# Patient Record
Sex: Male | Born: 1975
Health system: Southern US, Community
[De-identification: ages and names within clinical notes are randomized; demographics above are authoritative.]

## PROBLEM LIST (undated history)

## (undated) ENCOUNTER — Emergency Department: Discharge status: Absent without leave | Payer: BC Managed Care – PPO

## (undated) DIAGNOSIS — E559 Vitamin D deficiency, unspecified: Secondary | ICD-10-CM

## (undated) DIAGNOSIS — E785 Hyperlipidemia, unspecified: Secondary | ICD-10-CM

## (undated) DIAGNOSIS — R6882 Decreased libido: Secondary | ICD-10-CM

## (undated) HISTORY — PX: HAND SURGERY: SHX662

## (undated) HISTORY — PX: BACK SURGERY: SHX140

---
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
## 2001-03-28 ENCOUNTER — Ambulatory Visit (HOSPITAL_COMMUNITY): Admission: RE | Admit: 2001-03-28 | Discharge: 2001-03-28 | Payer: Self-pay | Admitting: Neurological Surgery

## 2001-03-28 ENCOUNTER — Encounter: Payer: Self-pay | Admitting: Neurological Surgery

## 2001-07-30 ENCOUNTER — Emergency Department (HOSPITAL_COMMUNITY): Admission: EM | Admit: 2001-07-30 | Discharge: 2001-07-31 | Payer: Self-pay | Admitting: Internal Medicine

## 2003-04-12 ENCOUNTER — Emergency Department (HOSPITAL_COMMUNITY): Admission: EM | Admit: 2003-04-12 | Discharge: 2003-04-12 | Payer: Self-pay | Admitting: Emergency Medicine

## 2009-04-30 ENCOUNTER — Ambulatory Visit: Payer: Self-pay | Admitting: Orthopedic Surgery

## 2009-04-30 ENCOUNTER — Encounter (INDEPENDENT_AMBULATORY_CARE_PROVIDER_SITE_OTHER): Payer: Self-pay | Admitting: *Deleted

## 2009-04-30 DIAGNOSIS — M25519 Pain in unspecified shoulder: Secondary | ICD-10-CM

## 2009-04-30 DIAGNOSIS — M7710 Lateral epicondylitis, unspecified elbow: Secondary | ICD-10-CM | POA: Insufficient documentation

## 2009-05-03 ENCOUNTER — Encounter: Payer: Self-pay | Admitting: Orthopedic Surgery

## 2009-10-14 ENCOUNTER — Ambulatory Visit: Payer: Self-pay | Admitting: Orthopedic Surgery

## 2009-10-14 DIAGNOSIS — M659 Unspecified synovitis and tenosynovitis, unspecified site: Secondary | ICD-10-CM | POA: Insufficient documentation

## 2009-10-29 ENCOUNTER — Ambulatory Visit: Payer: Self-pay | Admitting: Orthopedic Surgery

## 2011-05-08 NOTE — Op Note (Signed)
[facility]  Patient:    Kyle Atkins, Kyle Atkins                        MRN: [id number] Proc. Date: 03/28/01 Adm. Date:  [id number] Disc. Date: [id number] Attending:  Jonne Ply                           Operative Report  PREOPERATIVE DIAGNOSIS:  L4-5 herniated nucleus pulposus on the right.  POSTOPERATIVE DIAGNOSIS:  L4-5 herniated nucleus pulposus on the right.  PROCEDURE:  Lumbar microendoscopic diskectomy, L4-5 right, with microdissection technique and operating microscope.  SURGEON:  Stefani Dama, M.D.  FIRST ASSISTANT:  Tanya Nones. Jeral Fruit, M.D.  ANESTHESIA:  General endotracheal.  INDICATIONS:  The patient is a 35 year old right-handed individual who has developed significant, quite severe back and right lower extremity pain with some weakness in the tibialis anterior group.  He was found to have a large herniated nucleus pulposus at the L4-5 level, eccentric to the right side.  He has failed efforts at conservative management over the past number of weeks, and he has continued and worsening pain and was advised regarding a microendoscopic diskectomy.  He was taken to the operating room.  PROCEDURE:  The patient was brought to operating room supine on the stretcher. After smooth induction of general endotracheal anesthesia, he was turned prone, and the back was shaved, prepped with Duraprep, and draped in a sterile fashion.  Fluoroscopy was used to localize the L4-5 level, first in the PA plane and then in the lateral plane and then by making a small incision overlying the right side of the back over the L4-5 space.  A K-wire was passed to the inferior margin ligament of L5.  Then using a small dilator and a winding technique, an opening was created down to the L4-5 interspace. A series of dilators was passed over the initial dilator until the 18 mm diameter was achieved.  An 18 mm x 4 cm cannula was then placed in this region.  The  inserts were removed.  The microscope was draped and brought into the field.  Monopolar cautery was used to cauterize some of the superficial tissues in this area to expose the lamina of L4 out to the mesial wall of the facet, and using an Ansbach drill and a 2.8 mm dissecting tool, the inferior margin of the lamina of L4 was removed out to the mesial wall of the facet.  The annular ligament was taken up in this region, and the underlying common dural tube was identified.  Some epidural veins were cauterized and divided using microdissection technique.  The L5 nerve root was noted to be bowed dorsally, and this was dissected gently.  Underneath this was noted to be a contained ligament, which was bulged dorsally.  The ligament was incised in a longitudinal fashion, and then several fragments of disk were extracted through this opening, but most of the fragments were adherent to the disk within that contiguous disk space.  Then using a combination of curettes and rongeurs, the disk space was evacuated of a rather significant quantity of markedly degenerated disk material.  It was estimated that approximately 50% volume of the disk was removed this way, which allowed, however, for good laxity on the common dural tube and the takeoff of the L5 nerve root in this area.  The disk space was completely evacuated in this  fashion, and then using further microdissection technique, the pads of the L5 nerve root at the foramen were checked, as was the relaxation on the common dural tube.  The area was then checked for hemostasis in the soft tissue and epidural veins. After copiously irrigating with antibiotic irrigating solution on checking, there was indeed of any spinal fluid leaks, the cannula was then removed, and then the microscope was removed from the field.  A single stitch of 3-0 Vicryl was placed in the fascia, and 3-0 Vicryl was used to close the subcuticular tissues.  Blood loss was  estimated at less than 50 cc. DD:  03/28/01 TD:  03/28/01 Job: [id number]

## 2011-05-08 NOTE — Op Note (Signed)
[facility]  Patient:    Kyle Atkins, Kyle Atkins                       MRN: [id number] Proc. Date: 03/28/01 Attending:  Stefani Dama, M.D.                           Operative Report  PREOPERATIVE DIAGNOSIS:  L4-5 herniated nucleus pulposus on the right.  POSTOPERATIVE DIAGNOSIS:  L4-5 herniated nucleus pulposus on the right.  OPERATION PERFORMED:  Lumbar microendoscopic diskectomy, L4-5 right with microdissection technique and operating microscope.  SURGEON:  Stefani Dama, M.D.  ASSISTANT:  Tanya Nones. Jeral Fruit, M.D.  ANESTHESIA:  General endotracheal.  INDICATIONS FOR PROCEDURE:  The patient is a 35 year old right-handed individual who developed significant quite severe back and right lower extremity pain with some weakness in the tibialis anterior group.  He was found to have a  large herniated nucleus pulposus at the L4-5 level eccentric to the right side.  He has failed efforts at conservative management over the past number of weeks and he has continued in worsening pain and was advised regarding a microendoscopic diskectomy.  He was taken to the operating room.  DESCRIPTION OF PROCEDURE:  The patient was brought to the operating room supine on the stretcher.  After the smooth induction of general endotracheal anesthesia, he was turned prone and the back was shaved and prepped with DuraPrep and draped in a sterile fashion.  Fluoroscopy was used to localize the L4-5 level first in the PA plane and then in the lateral plane and then by making a small incision overlying the right side of the back over the L4-5 space.  A K-wire was passed through the inferior margin of the lamina of L5. Then using a small dilator and a wanding technique, an opening was created down to the L4-5 interspace.  A series of dilators was passed over the initial dilator until the 18 mm diameter was achieved.  An 18 mm x 4 cm cannula was then placed in this region and the  inserts were removed.  The microscope was draped and brought into the field.  Monopolar cautery was used to cauterize some of the superficial tissues in this area to expose the lamina of L4 out to the mesial wall of the facet and using an Anspach drill and 2.8 mm dissecting tool, the inferior margin of the lamina of L4 was removed out to the mesial wall of the facet.  The yellow ligament was taken up in this region and the underlying common dural tube was identified.  Some epidural veins were cauterized and divided using a microdissection technique.  The L5 nerve root was noted to be bowed dorsally and this was dissected gently and underneath this was noted to be a contained ligament which was bulged dorsally.  The ligament was incised in a longitudinal fashion and then several fragments of disk were extracted through this opening but most of the fragments were adherent to the disk within the contiguous disk space.  Then using a combination of curets and rongeurs, the disk space was evacuated of a rather significant quantity of markedly degenerated disk material.  It was estimated that approximately 50% volume of the disk was removed this way.  This allowed, however, for good laxity on the common dural tube and the take off of the L5 nerve root in this area.  The disk space  was completely evacuated in this fashion and then using further microdissection technique, the pads of the L5 nerve root out the foramen was checked as was relaxation on the common dural tube.  The area was then checked for hemostasis in the soft tissue and the epidural veins and after copious irrigation with antibiotic irrigating solution, then checking if there were indeed no evidence of any spinal fluid leaks, the cannula was removed and then the microscope was removed from the field.  A single suture of 3-0 Vicryl was placed in the fascia and 3-0 Vicryl was used to close the subcuticular tissues.  Estimated blood loss  for this procedure was less than 50 cc. DD:  03/28/01 TD:  03/28/01 Job: [id number]

## 2012-02-09 ENCOUNTER — Ambulatory Visit: Payer: Self-pay | Admitting: Gastroenterology

## 2012-02-09 ENCOUNTER — Ambulatory Visit (INDEPENDENT_AMBULATORY_CARE_PROVIDER_SITE_OTHER): Payer: BC Managed Care – PPO | Admitting: Gastroenterology

## 2012-02-09 ENCOUNTER — Encounter: Payer: Self-pay | Admitting: Gastroenterology

## 2012-02-09 DIAGNOSIS — K625 Hemorrhage of anus and rectum: Secondary | ICD-10-CM

## 2012-02-09 DIAGNOSIS — K649 Unspecified hemorrhoids: Secondary | ICD-10-CM | POA: Insufficient documentation

## 2012-02-09 MED ORDER — LIDOCAINE-HYDROCORTISONE ACE 3-1 % RE KIT: 1.0000 "application " | PACK | Freq: Two times a day (BID) | RECTAL | Status: DC

## 2012-02-09 MED ORDER — PEG-KCL-NACL-NASULF-NA ASC-C 100 G PO SOLR: 1.0000 | Freq: Once | ORAL | Status: DC

## 2012-02-09 NOTE — Assessment & Plan Note (Signed)
Intermittent rectal bleeding in setting of anorectal irritation, likely benign source. Given chronicity of symptoms and failure to topical medications for hemorrhoids, he needs colonoscopy.  I have discussed the risks, alternatives, benefits with regards to but not limited to the risk of reaction to medication, bleeding, infection, perforation and the patient is agreeable to proceed. Written consent to be obtained.  Trial of anamantle forte.

## 2012-02-09 NOTE — Patient Instructions (Signed)
Prescription for hemorrhoid medicine sent to [facility]. We have scheduled you for a colonoscopy. Please see separate instructions.

## 2012-02-09 NOTE — Progress Notes (Signed)
No PCP on file 

## 2012-02-09 NOTE — Progress Notes (Signed)
Primary Care Physician:  No primary provider on file.  Primary Gastroenterologist:  Michael Rourk, MD   Chief Complaint  Patient presents with  . Hemorrhoids    HPI:  Kyle Atkins is a 35 y.o. male here for further evaluation of hemorrhoids. He c/o several year history of intermittent anorectal irritation/itching/burning and intermittent rectal bleeding. Symptoms persistent for past 4-5 months. Tried Prep-H. Few years ago saw Dr. Tapper, numbing medications. Little bit blood with wiping. Itching/burning all the time. Feels knotted up at his bottom. BM daily. No melena. No abdominal pain. Works out a lot, eats six times a day. Heavy weight lifting at the gym. Heartburn occasionally the last couple of weeks. High protein diet for 3-4 months. No dysphagia. Doesn't have to take anything for the heartburn.   Current Outpatient Prescriptions  Medication Sig Dispense Refill  . fish oil-omega-3 fatty acids 1000 MG capsule Take 2 g by mouth daily.      . Multiple Vitamin (MULTIVITAMIN) capsule Take 1 capsule by mouth daily.            0       0    Allergies as of 02/09/2012  . (No Known Allergies)    History reviewed. No pertinent past medical history.  Past Surgical History  Procedure Date  . Back surgery     lower  . Hand surgery     right    Family History  Problem Relation Age of Onset  . Colon cancer Neg Hx   . Heart disease Father 52    History   Social History  . Marital Status: Married    Spouse Name: N/A    Number of Children: 0  . Years of Education: N/A   Occupational History  . [employer]    Social History Main Topics  . Smoking status: Former Smoker  . Smokeless tobacco: Not on file   Comment: only as teenager  . Alcohol Use: Yes     socially, once per month  . Drug Use: No  . Sexually Active: Not on file   Other Topics Concern  . Not on file   Social History Narrative  . No narrative on file      ROS:  General: Negative for anorexia,  weight loss, fever, chills, fatigue, weakness. Eyes: Negative for vision changes.  ENT: Negative for hoarseness, difficulty swallowing , nasal congestion. CV: Negative for chest pain, angina, palpitations, dyspnea on exertion, peripheral edema.  Respiratory: Negative for dyspnea at rest, dyspnea on exertion, cough, sputum, wheezing.  GI: See history of present illness. GU:  Negative for dysuria, hematuria, urinary incontinence, urinary frequency, nocturnal urination.  MS: Negative for joint pain, low back pain.  Derm: Negative for rash or itching.  Neuro: Negative for weakness, abnormal sensation, seizure, frequent headaches, memory loss, confusion.  Psych: Negative for anxiety, depression, suicidal ideation, hallucinations.  Endo: Negative for unusual weight change.  Heme: Negative for bruising or bleeding. Allergy: Negative for rash or hives.    Physical Examination:  BP 120/68  Pulse 70  Temp(Src) 97.6 F (36.4 C) (Temporal)  Ht 5' 9" (1.753 m)  Wt 183 lb (83.008 kg)  BMI 27.02 kg/m2   General: Well-nourished, well-developed in no acute distress.  Head: Normocephalic, atraumatic.   Eyes: Conjunctiva pink, no icterus. Mouth: Oropharyngeal mucosa moist and pink , no lesions erythema or exudate. Neck: Supple without thyromegaly, masses, or lymphadenopathy.  Lungs: Clear to auscultation bilaterally.  Heart: Regular rate and rhythm, no   murmurs rubs or gallops.  Abdomen: Bowel sounds are normal, nontender, nondistended, no hepatosplenomegaly or masses, no abdominal bruits or hernia , no rebound or guarding.   Rectal: Defer to time of colonoscopy.  Extremities: No lower extremity edema. No clubbing or deformities.  Neuro: Alert and oriented x 4 , grossly normal neurologically.  Skin: Warm and dry, no rash or jaundice.   Psych: Alert and cooperative, normal mood and affect.     

## 2012-02-12 MED ORDER — POVIDONE-IODINE 10 % EX OINT
TOPICAL_OINTMENT | CUTANEOUS | Status: AC
  Filled 2012-02-12: qty 2

## 2012-02-12 MED ORDER — BUPIVACAINE HCL (PF) 0.5 % IJ SOLN
INTRAMUSCULAR | Status: AC
  Filled 2012-02-12: qty 30

## 2012-02-12 MED ORDER — SODIUM CHLORIDE 0.45 % IV SOLN
Freq: Once | INTRAVENOUS | Status: AC
  Administered 2012-02-15: 14:00:00 via INTRAVENOUS

## 2012-02-15 ENCOUNTER — Encounter (HOSPITAL_COMMUNITY): Payer: Self-pay

## 2012-02-15 ENCOUNTER — Ambulatory Visit (HOSPITAL_COMMUNITY)
Admission: RE | Admit: 2012-02-15 | Discharge: 2012-02-15 | Disposition: A | Discharge status: Home / Self Care | Payer: BC Managed Care – PPO | Source: Ambulatory Visit | Attending: Internal Medicine | Admitting: Internal Medicine

## 2012-02-15 ENCOUNTER — Encounter (HOSPITAL_COMMUNITY)
Admission: RE | Disposition: A | Discharge status: Home / Self Care | Payer: Self-pay | Source: Ambulatory Visit | Attending: Internal Medicine

## 2012-02-15 DIAGNOSIS — K648 Other hemorrhoids: Secondary | ICD-10-CM | POA: Insufficient documentation

## 2012-02-15 DIAGNOSIS — K6289 Other specified diseases of anus and rectum: Secondary | ICD-10-CM

## 2012-02-15 DIAGNOSIS — K625 Hemorrhage of anus and rectum: Secondary | ICD-10-CM

## 2012-02-15 DIAGNOSIS — D129 Benign neoplasm of anus and anal canal: Secondary | ICD-10-CM

## 2012-02-15 DIAGNOSIS — K921 Melena: Secondary | ICD-10-CM

## 2012-02-15 DIAGNOSIS — D128 Benign neoplasm of rectum: Secondary | ICD-10-CM | POA: Insufficient documentation

## 2012-02-15 HISTORY — PX: COLONOSCOPY: SHX5424

## 2012-02-15 SURGERY — COLONOSCOPY
Anesthesia: Moderate Sedation

## 2012-02-15 MED ORDER — MEPERIDINE HCL 100 MG/ML IJ SOLN
INTRAMUSCULAR | Status: DC | PRN
  Administered 2012-02-15 (×2): 50 mg via INTRAVENOUS

## 2012-02-15 MED ORDER — STERILE WATER FOR IRRIGATION IR SOLN
Status: DC | PRN
  Administered 2012-02-15: 15:00:00

## 2012-02-15 MED ORDER — MIDAZOLAM HCL 5 MG/5ML IJ SOLN
INTRAMUSCULAR | Status: AC
  Filled 2012-02-15: qty 10

## 2012-02-15 MED ORDER — MIDAZOLAM HCL 5 MG/5ML IJ SOLN
INTRAMUSCULAR | Status: DC | PRN
  Administered 2012-02-15: 1 mg via INTRAVENOUS
  Administered 2012-02-15 (×3): 2 mg via INTRAVENOUS

## 2012-02-15 MED ORDER — MEPERIDINE HCL 100 MG/ML IJ SOLN
INTRAMUSCULAR | Status: AC
  Filled 2012-02-15: qty 1

## 2012-02-15 NOTE — H&P (View-Only) (Signed)
Primary Care Physician:  No primary provider on file.  Primary Gastroenterologist:  Roetta Sessions, MD   Chief Complaint  Patient presents with  . Hemorrhoids    HPI:  Kyle Atkins is a 36 y.o. male here for further evaluation of hemorrhoids. He c/o several year history of intermittent anorectal irritation/itching/burning and intermittent rectal bleeding. Symptoms persistent for past 4-5 months. Tried Prep-H. Few years ago saw Dr. Margo Common, numbing medications. Little bit blood with wiping. Itching/burning all the time. Feels knotted up at his bottom. BM daily. No melena. No abdominal pain. Works out a lot, eats six times a day. Heavy weight lifting at the gym. Heartburn occasionally the last couple of weeks. High protein diet for 3-4 months. No dysphagia. Doesn't have to take anything for the heartburn.   Current Outpatient Prescriptions  Medication Sig Dispense Refill  . fish oil-omega-3 fatty acids 1000 MG capsule Take 2 g by mouth daily.      . Multiple Vitamin (MULTIVITAMIN) capsule Take 1 capsule by mouth daily.            0       0    Allergies as of 02/09/2012  . (No Known Allergies)    History reviewed. No pertinent past medical history.  Past Surgical History  Procedure Date  . Back surgery     lower  . Hand surgery     right    Family History  Problem Relation Age of Onset  . Colon cancer Neg Hx   . Heart disease Father 63    History   Social History  . Marital Status: Married    Spouse Name: N/A    Number of Children: 0  . Years of Education: N/A   Occupational History  . [employer]    Social History Main Topics  . Smoking status: Former Games developer  . Smokeless tobacco: Not on file   Comment: only as teenager  . Alcohol Use: Yes     socially, once per month  . Drug Use: No  . Sexually Active: Not on file   Other Topics Concern  . Not on file   Social History Narrative  . No narrative on file      ROS:  General: Negative for anorexia,  weight loss, fever, chills, fatigue, weakness. Eyes: Negative for vision changes.  ENT: Negative for hoarseness, difficulty swallowing , nasal congestion. CV: Negative for chest pain, angina, palpitations, dyspnea on exertion, peripheral edema.  Respiratory: Negative for dyspnea at rest, dyspnea on exertion, cough, sputum, wheezing.  GI: See history of present illness. GU:  Negative for dysuria, hematuria, urinary incontinence, urinary frequency, nocturnal urination.  MS: Negative for joint pain, low back pain.  Derm: Negative for rash or itching.  Neuro: Negative for weakness, abnormal sensation, seizure, frequent headaches, memory loss, confusion.  Psych: Negative for anxiety, depression, suicidal ideation, hallucinations.  Endo: Negative for unusual weight change.  Heme: Negative for bruising or bleeding. Allergy: Negative for rash or hives.    Physical Examination:  BP 120/68  Pulse 70  Temp(Src) 97.6 F (36.4 C) (Temporal)  Ht 5\' 9"  (1.753 m)  Wt 183 lb (83.008 kg)  BMI 27.02 kg/m2   General: Well-nourished, well-developed in no acute distress.  Head: Normocephalic, atraumatic.   Eyes: Conjunctiva pink, no icterus. Mouth: Oropharyngeal mucosa moist and pink , no lesions erythema or exudate. Neck: Supple without thyromegaly, masses, or lymphadenopathy.  Lungs: Clear to auscultation bilaterally.  Heart: Regular rate and rhythm, no  murmurs rubs or gallops.  Abdomen: Bowel sounds are normal, nontender, nondistended, no hepatosplenomegaly or masses, no abdominal bruits or hernia , no rebound or guarding.   Rectal: Defer to time of colonoscopy.  Extremities: No lower extremity edema. No clubbing or deformities.  Neuro: Alert and oriented x 4 , grossly normal neurologically.  Skin: Warm and dry, no rash or jaundice.   Psych: Alert and cooperative, normal mood and affect.

## 2012-02-15 NOTE — Op Note (Signed)
[facility] [address] [place], [place]  [address]  COLONOSCOPY PROCEDURE REPORT  PATIENT:  Kyle Atkins, Kyle Atkins  MR#:  [id number] BIRTHDATE:  Jul 19, 1976, 35 yrs. old  GENDER:  male ENDOSCOPIST:  R. Roetta Sessions, MD FACP [facility] REF. BY:          self PROCEDURE DATE:  02/15/2012 PROCEDURE:  Ileocolonoscopy with biopsy  INDICATIONS:  Anorectal burning; hematochezia  INFORMED CONSENT:  The risks, benefits, alternatives and imponderables including but not limited to bleeding, perforation as well as the possibility of a missed lesion have been reviewed. The potential for biopsy, lesion removal, etc. have also been discussed.  Questions have been answered.  All parties agreeable. Please see the history and physical in the medical record for more information.  MEDICATIONS:  Versed 7 mg IV and Demerol 100 mg in divided doses  DESCRIPTION OF PROCEDURE:  After a digital rectal exam was performed, the EC-[id number] ([id number]) colonoscope was advanced from the anus through the rectum and colon to the area of the cecum, ileocecal valve and appendiceal orifice.  The cecum was deeply intubated.  These structures were well-seen and photographed for the record.  From the level of the cecum and ileocecal valve, the scope was slowly and cautiously withdrawn.  The mucosal surfaces were carefully surveyed utilizing scope tip deflection to facilitate fold flattening as needed.  The scope was pulled down into the rectum where a thorough examination including retroflexion was performed. <<PROCEDUREIMAGES>>  FINDINGS:  Good preparation. No external lesions. Digital rectal exam entirely normal. good preparation. Minimal internal hemorrhoids; otherwise normal rectum. At the rectosigmoid junction, there was a hyperplastic appearing polyp 3 mm in dimensions. The remainder of the colonic mucosa and the distal 10 cm of terminal ileal mucosa appeared normal  THERAPEUTIC / DIAGNOSTIC MANEUVERS PERFORMED:   The rectosigmoid polyp was cold biopsied/removed  COMPLICATIONS:  None  CECAL WITHDRAWAL TIME:   12 minutes  IMPRESSION:    Minimal internal hemorrhoids; single diminutive rectosigmoid polyp-removed as described above. Remainder of rectum, colon and terminal ileum appeared normal.  Suspect benign anorectal bleeding most likely from hemorrhoids. A small occult fissures not absolutely excluded this time.  RECOMMENDATIONS:    Moderate lifting heavy weights for now. Continue fiber supplement. Begin AnaMantle HC forte cream to the anorectum  twice daily.  Further recommendations to follow pending review of pathology report.  ______________________________ R. Roetta Sessions, MD Caleen Essex  CC:  n. eSIGNED:   R. Roetta Sessions at 02/15/2012 03:19 PM  [place], Barbara Cower, [phone]

## 2012-02-15 NOTE — Interval H&P Note (Signed)
History and Physical Interval Note:  02/15/2012 2:29 PM  Kyle Atkins  has presented today for surgery, with the diagnosis of rectal bleeding  The various methods of treatment have been discussed with the patient and family. After consideration of risks, benefits and other options for treatment, the patient has consented to  Procedure(s) (LRB): COLONOSCOPY (N/A) as a surgical intervention .  The patients' history has been reviewed, patient examined, no change in status, stable for surgery.  I have reviewed the patients' chart and labs.  Questions were answered to the patient's satisfaction.     Eula Listen

## 2012-02-18 ENCOUNTER — Encounter: Payer: Self-pay | Admitting: Internal Medicine

## 2012-02-22 ENCOUNTER — Encounter (HOSPITAL_COMMUNITY): Payer: Self-pay | Admitting: Internal Medicine

## 2013-01-26 ENCOUNTER — Other Ambulatory Visit (HOSPITAL_COMMUNITY): Payer: Self-pay | Admitting: Internal Medicine

## 2013-01-26 DIAGNOSIS — M549 Dorsalgia, unspecified: Secondary | ICD-10-CM

## 2013-01-30 ENCOUNTER — Ambulatory Visit (HOSPITAL_COMMUNITY)
Admission: RE | Admit: 2013-01-30 | Discharge: 2013-01-30 | Disposition: A | Discharge status: Home / Self Care | Payer: BC Managed Care – PPO | Source: Ambulatory Visit | Attending: Internal Medicine | Admitting: Internal Medicine

## 2013-01-30 ENCOUNTER — Encounter (HOSPITAL_COMMUNITY): Payer: Self-pay

## 2013-01-30 DIAGNOSIS — M79609 Pain in unspecified limb: Secondary | ICD-10-CM | POA: Insufficient documentation

## 2013-01-30 DIAGNOSIS — M5126 Other intervertebral disc displacement, lumbar region: Secondary | ICD-10-CM | POA: Insufficient documentation

## 2013-01-30 DIAGNOSIS — M549 Dorsalgia, unspecified: Secondary | ICD-10-CM

## 2013-01-30 DIAGNOSIS — M545 Low back pain, unspecified: Secondary | ICD-10-CM | POA: Insufficient documentation

## 2016-01-10 DIAGNOSIS — B029 Zoster without complications: Secondary | ICD-10-CM | POA: Insufficient documentation

## 2016-01-10 DIAGNOSIS — Z87891 Personal history of nicotine dependence: Secondary | ICD-10-CM | POA: Diagnosis not present

## 2016-01-10 DIAGNOSIS — R21 Rash and other nonspecific skin eruption: Secondary | ICD-10-CM | POA: Diagnosis present

## 2016-01-10 DIAGNOSIS — Z79899 Other long term (current) drug therapy: Secondary | ICD-10-CM | POA: Insufficient documentation

## 2016-01-11 ENCOUNTER — Emergency Department (HOSPITAL_COMMUNITY)
Admission: EM | Admit: 2016-01-11 | Discharge: 2016-01-11 | Disposition: A | Discharge status: Home / Self Care | Payer: Managed Care, Other (non HMO) | Attending: Emergency Medicine | Admitting: Emergency Medicine

## 2016-01-11 DIAGNOSIS — B029 Zoster without complications: Secondary | ICD-10-CM

## 2016-01-11 MED ORDER — HYDROCODONE-ACETAMINOPHEN 5-325 MG PO TABS: ORAL_TABLET | ORAL | Status: DC

## 2016-01-11 MED ORDER — ACYCLOVIR 400 MG PO TABS: 400.0000 mg | ORAL_TABLET | Freq: Every day | ORAL | Status: DC

## 2016-01-11 MED ORDER — ACYCLOVIR 800 MG PO TABS
800.0000 mg | ORAL_TABLET | Freq: Once | ORAL | Status: AC
  Administered 2016-01-11: 800 mg via ORAL
  Filled 2016-01-11: qty 1

## 2016-01-11 NOTE — ED Provider Notes (Signed)
CSN: [id number]     Arrival date & time 01/10/16  2302 History   First MD Initiated Contact with Patient 01/11/16 0008     Chief Complaint  Patient presents with  . Rash     (Consider location/radiation/quality/duration/timing/severity/associated sxs/prior Treatment) HPI   Kyle Atkins is a 40 y.o. male who presents to the Emergency Department complaining of painful rash for 2-3 days.  He states that he noticed itching and tingling to his left shoulder blade area at onset and states his wife noticed several "red bumps" to the area tonight and and few to the under arm area with a "raw" burning sensation to the left chest.  He denies fever, neck pain, N/V and chills.  He has not tried any medications for symptom relief.      No past medical history on file. Past Surgical History  Procedure Laterality Date  . Back surgery      lower  . Hand surgery      right  . Colonoscopy  02/15/2012    Procedure: COLONOSCOPY;  Surgeon: Daneil Dolin, MD;  Location: [facility];  Service: Endoscopy;  Laterality: N/A;  2:30   Family History  Problem Relation Age of Onset  . Colon cancer Neg Hx   . Heart disease Father 69   Social History  Substance Use Topics  . Smoking status: Former Research scientist (life sciences)  . Smokeless tobacco: Not on file     Comment: only as teenager  . Alcohol Use: Yes     Comment: socially, once per month    Review of Systems  Constitutional: Negative for fever, chills and appetite change.  HENT: Negative for facial swelling, sore throat and trouble swallowing.   Respiratory: Negative for shortness of breath and wheezing.   Gastrointestinal: Negative for nausea, vomiting and abdominal pain.  Musculoskeletal: Negative for neck pain and neck stiffness.  Skin: Positive for rash. Negative for wound.  Neurological: Negative for dizziness, weakness, numbness and headaches.  All other systems reviewed and are negative.     Allergies  Review of patient's allergies indicates no  known allergies.  Home Medications   Prior to Admission medications   Medication Sig Start Date End Date Taking? Authorizing Provider  acyclovir (ZOVIRAX) 400 MG tablet Take 1 tablet (400 mg total) by mouth 5 (five) times daily. For 10 days 01/11/16   Tacha Manni, PA-C  fish oil-omega-3 fatty acids 1000 MG capsule Take 2 g by mouth daily.    Historical Provider, MD  HYDROcodone-acetaminophen (NORCO/VICODIN) 5-325 MG tablet Take one-two tabs po q 4-6 hrs prn pain 01/11/16   Arlie Posch, PA-C  HYDROcodone-acetaminophen (NORCO/VICODIN) 5-325 MG tablet Take one-two tabs po q 4-6 hrs prn pain 01/11/16   Jakhia Buxton, PA-C  lidocaine-hydrocortisone (ANAMANTLE) 3-1 % KIT Place 1 application rectally 2 (two) times daily. For 14 days. 02/09/12   Mahala Menghini, PA-C  Multiple Vitamin (MULTIVITAMIN) capsule Take 1 capsule by mouth daily.    Historical Provider, MD  peg 3350 powder (MOVIPREP) 100 G SOLR Take 1 kit (100 g total) by mouth once. As directed Please purchase 1 Fleets enema to use with the prep 02/09/12   Daneil Dolin, MD   BP 134/89 mmHg  Pulse 62  Temp(Src) 97.9 F (36.6 C) (Oral)  Resp 16  SpO2 100% Physical Exam  Constitutional: He is oriented to person, place, and time. He appears well-developed and well-nourished. No distress.  HENT:  Head: Normocephalic and atraumatic.  Mouth/Throat: Oropharynx is clear  and moist.  Neck: Normal range of motion. Neck supple.  Cardiovascular: Normal rate, regular rhythm, normal heart sounds and intact distal pulses.   No murmur heard. Pulmonary/Chest: Effort normal and breath sounds normal. No respiratory distress.  Musculoskeletal: He exhibits no edema or tenderness.  Lymphadenopathy:    He has no cervical adenopathy.  Neurological: He is alert and oriented to person, place, and time. He exhibits normal muscle tone. Coordination normal.  Skin: Skin is warm. Rash noted. There is erythema.  Grouped, erythematous vesicles to the left upper  back and left axilla.  No pustules or edema.    Nursing note and vitals reviewed.   ED Course  Procedures (including critical care time) Labs Review Labs Reviewed - No data to display  Imaging Review No results found. I have personally reviewed and evaluated these images and lab results as part of my medical decision-making.    MDM   Final diagnoses:  Zoster    Pt is non-toxic appearing.  Rash c/w zoster.  He agrees to tx plan with acyclovir, vicodin for pain and close PMD f/u if needed.  Appears stable for d/c    Kem Parkinson, PA-C 01/11/16 [place], DO 01/12/16 0150

## 2016-01-11 NOTE — ED Notes (Signed)
Pt has a rash to his left posterior ribcage and having some raw feeling to his left chest area x 3 days.

## 2016-01-11 NOTE — Discharge Instructions (Signed)

## 2016-01-13 MED FILL — Hydrocodone-Acetaminophen Tab 5-325 MG: ORAL | Qty: 6 | Status: AC

## 2017-08-17 ENCOUNTER — Telehealth: Payer: Self-pay | Admitting: Internal Medicine

## 2017-08-17 NOTE — Telephone Encounter (Signed)
Patient called to make appointment, said he was told by [place] that he needed to come back when he was 39 for another tcs.  Could not find any recommendations in his chart.  Please advise if/when he needs an office visit or colonoscopy.

## 2017-08-18 NOTE — Telephone Encounter (Signed)
I'm not sure that information came from. Reviewed the record. No family history of colon cancer. I recommend his next colonoscopy would be for screening purposes at age 41.

## 2017-08-19 NOTE — Telephone Encounter (Signed)
Kyle Atkins, please let the pt know.

## 2017-08-19 NOTE — Telephone Encounter (Signed)
Made patient aware.  He is still coming to his appointment due to rectal pain

## 2017-08-20 ENCOUNTER — Ambulatory Visit (INDEPENDENT_AMBULATORY_CARE_PROVIDER_SITE_OTHER): Payer: Managed Care, Other (non HMO) | Admitting: Gastroenterology

## 2017-08-20 ENCOUNTER — Telehealth: Payer: Self-pay

## 2017-08-20 ENCOUNTER — Encounter: Payer: Self-pay | Admitting: Gastroenterology

## 2017-08-20 VITALS — BP 123/73 | HR 70 | Temp 97.6°F | Ht 69.0 in | Wt 174.4 lb

## 2017-08-20 DIAGNOSIS — K640 First degree hemorrhoids: Secondary | ICD-10-CM | POA: Diagnosis not present

## 2017-08-20 DIAGNOSIS — K6289 Other specified diseases of anus and rectum: Secondary | ICD-10-CM | POA: Diagnosis not present

## 2017-08-20 DIAGNOSIS — Z800 Family history of malignant neoplasm of digestive organs: Secondary | ICD-10-CM | POA: Diagnosis not present

## 2017-08-20 DIAGNOSIS — K625 Hemorrhage of anus and rectum: Secondary | ICD-10-CM

## 2017-08-20 MED ORDER — HYDROCORTISONE 2.5 % RE CREA: 1.0000 "application " | TOPICAL_CREAM | Freq: Two times a day (BID) | RECTAL | 0 refills | Status: DC

## 2017-08-20 NOTE — Progress Notes (Addendum)
Primary Care Physician:  Celene Squibb, MD  Primary Gastroenterologist:  Garfield Cornea, MD   Chief Complaint  Patient presents with  . Rectal Pain    HPI:  Kyle Atkins is a 41 y.o. male here For further evaluation of rectal pain and rectal bleeding. Patient colonoscopy in 2013, hemorrhoids and hyperplastic polyp. He states someone told him he should have another colonoscopy when he turned 89. He has a family history of colon cancer, maternal aunt in her 23s. He is not sure if his mother's personal history of colon polyps.  Patient has bowel movement every day. Generally not hard. I will times per week he'll have painful bowel movements, usually several in a row. Notes bright red blood with it. No melena. No abdominal pain. He's used Preparation H with minimal relief. No upper GI symptoms. No aspirin products or NSAIDs.  No current outpatient prescriptions on file.   No current facility-administered medications for this visit.     Allergies as of 08/20/2017  . (No Known Allergies)    History reviewed. No pertinent past medical history.  Past Surgical History:  Procedure Laterality Date  . BACK SURGERY     lower, X2  . COLONOSCOPY  02/15/2012   Dr. Gala Romney, internal hemorrhoids, hyperplastic polyp.   Marland Kitchen HAND SURGERY     right    Family History  Problem Relation Age of Onset  . Heart disease Father 64  . Colon cancer Maternal Aunt        34s  . Colon polyps Mother     Social History   Social History  . Marital status: Married    Spouse name: N/A  . Number of children: 0  . Years of education: N/A   Occupational History  . [employer]    Social History Main Topics  . Smoking status: Former Research scientist (life sciences)  . Smokeless tobacco: Never Used     Comment: only as teenager  . Alcohol use Yes     Comment: socially, once per month  . Drug use: No  . Sexual activity: Not on file   Other Topics Concern  . Not on file   Social History Narrative  . No narrative on file       ROS:  General: Negative for anorexia, weight loss, fever, chills, fatigue, weakness. Eyes: Negative for vision changes.  ENT: Negative for hoarseness, difficulty swallowing , nasal congestion. CV: Negative for chest pain, angina, palpitations, dyspnea on exertion, peripheral edema.  Respiratory: Negative for dyspnea at rest, dyspnea on exertion, cough, sputum, wheezing.  GI: See history of present illness. GU:  Negative for dysuria, hematuria, urinary incontinence, urinary frequency, nocturnal urination.  MS: Negative for joint pain, low back pain.  Derm: Negative for rash or itching.  Neuro: Negative for weakness, abnormal sensation, seizure, frequent headaches, memory loss, confusion.  Psych: Negative for anxiety, depression, suicidal ideation, hallucinations.  Endo: Negative for unusual weight change.  Heme: Negative for bruising or bleeding. Allergy: Negative for rash or hives.    Physical Examination:  BP 123/73   Pulse 70   Temp 97.6 F (36.4 C) (Oral)   Ht 5\' 9"  (1.753 m)   Wt 174 lb 6.4 oz (79.1 kg)   BMI 25.75 kg/m    General: Well-nourished, well-developed in no acute distress.  Head: Normocephalic, atraumatic.   Eyes: Conjunctiva pink, no icterus. Mouth: Oropharyngeal mucosa moist and pink , no lesions erythema or exudate. Neck: Supple without thyromegaly, masses, or lymphadenopathy.  Lungs: Clear  to auscultation bilaterally.  Heart: Regular rate and rhythm, no murmurs rubs or gallops.  Abdomen: Bowel sounds are normal, nontender, nondistended, no hepatosplenomegaly or masses, no abdominal bruits or    hernia , no rebound or guarding.   Rectal: Externally, normal exam. Internal exam tolerable but mild discomfort noted. Likely palpable just inside the anal canal. Heme-negative stool. Extremities: No lower extremity edema. No clubbing or deformities.  Neuro: Alert and oriented x 4 , grossly normal neurologically.  Skin: Warm and dry, no rash or jaundice.    Psych: Alert and cooperative, normal mood and affect.

## 2017-08-20 NOTE — Progress Notes (Signed)
cc'd to pcp 

## 2017-08-20 NOTE — Assessment & Plan Note (Signed)
Intermittent rectal bleeding in the setting of anal rectal pain. Known internal hemorrhoids. Hyperplastic polyp. Suspect symptoms related to hemorrhoids. Cannot exclude anorectal fissure but not appreciated on exam today. Patient has a family history colon cancer, maternal aunt deceased in her 28s. He is not sure of his mother's personal history of colon polyps which would definitely change his surveillance colonoscopy schedule. I discussed with him at length, given his last colonoscopy was 5 years ago when he is having rectal bleeding associated with rectal pain, we could consider colonoscopy at this time. His mother had one polyps would urge him to have colonoscopy at this time. He would like to discuss with his mother first. We will treat for possible hemorrhoids. Further recommendations to follow.

## 2017-08-20 NOTE — Telephone Encounter (Signed)
Pt called back and said that his mother has had 3 TCS and 2 out of the 3 had polyps that were benign

## 2017-08-20 NOTE — Patient Instructions (Signed)
1. Apply anusol cream inside your rectum twice daily for 14 days. RX sent to pharmacy.  2. Please find out if your mother has had colon polyps.  3. Would consider colonoscopy for rectal bleeding, rectal pain but as discussed today, we will decide after we know more of your mother's history.

## 2017-08-25 ENCOUNTER — Other Ambulatory Visit: Payer: Self-pay

## 2017-08-25 DIAGNOSIS — K625 Hemorrhage of anus and rectum: Secondary | ICD-10-CM

## 2017-08-25 DIAGNOSIS — K6289 Other specified diseases of anus and rectum: Secondary | ICD-10-CM

## 2017-08-25 DIAGNOSIS — Z800 Family history of malignant neoplasm of digestive organs: Secondary | ICD-10-CM

## 2017-08-25 DIAGNOSIS — Z8371 Family history of colonic polyps: Secondary | ICD-10-CM

## 2017-08-25 MED ORDER — NA SULFATE-K SULFATE-MG SULF 17.5-3.13-1.6 GM/177ML PO SOLN: 1.0000 | ORAL | 0 refills | Status: DC

## 2017-08-25 NOTE — Progress Notes (Signed)
Patient reports mother has had three colonoscopies, two of them have multiple colon polyps. Maternal aunt deceased in her 22s due to colon cancer.  Given FH and his rectal pain/bleeding, last TCS over five years ago, I would advise colonoscopy at this time.   If patient agreeable, please schedule TCS with RMR. Augment conscious sedation in phenergan 25mg  iv 45 minutes before.

## 2017-08-25 NOTE — Progress Notes (Signed)
Called and informed pt. He is agreeable to having colonoscopy. Colonoscopy with RMR scheduled for 09/10/17 at 2:00pm. Rx for prep sent to pharmacy. Orders entered. Instructions mailed.

## 2017-08-25 NOTE — Telephone Encounter (Signed)
Noted. See addendum to ov note for instructions.

## 2017-09-10 ENCOUNTER — Ambulatory Visit (HOSPITAL_COMMUNITY)
Admission: RE | Admit: 2017-09-10 | Discharge: 2017-09-10 | Disposition: A | Discharge status: Home / Self Care | Payer: Managed Care, Other (non HMO) | Source: Ambulatory Visit | Attending: Internal Medicine | Admitting: Internal Medicine

## 2017-09-10 ENCOUNTER — Encounter (HOSPITAL_COMMUNITY)
Admission: RE | Disposition: A | Discharge status: Home / Self Care | Payer: Self-pay | Source: Ambulatory Visit | Attending: Internal Medicine

## 2017-09-10 ENCOUNTER — Encounter (HOSPITAL_COMMUNITY): Payer: Self-pay | Admitting: *Deleted

## 2017-09-10 DIAGNOSIS — Z87891 Personal history of nicotine dependence: Secondary | ICD-10-CM | POA: Insufficient documentation

## 2017-09-10 DIAGNOSIS — K921 Melena: Secondary | ICD-10-CM

## 2017-09-10 DIAGNOSIS — K640 First degree hemorrhoids: Secondary | ICD-10-CM | POA: Insufficient documentation

## 2017-09-10 DIAGNOSIS — K635 Polyp of colon: Secondary | ICD-10-CM | POA: Insufficient documentation

## 2017-09-10 DIAGNOSIS — K6289 Other specified diseases of anus and rectum: Secondary | ICD-10-CM

## 2017-09-10 DIAGNOSIS — Z8719 Personal history of other diseases of the digestive system: Secondary | ICD-10-CM | POA: Insufficient documentation

## 2017-09-10 DIAGNOSIS — K625 Hemorrhage of anus and rectum: Secondary | ICD-10-CM

## 2017-09-10 DIAGNOSIS — Z83719 Family history of colon polyps, unspecified: Secondary | ICD-10-CM

## 2017-09-10 DIAGNOSIS — Z800 Family history of malignant neoplasm of digestive organs: Secondary | ICD-10-CM

## 2017-09-10 DIAGNOSIS — Z8371 Family history of colonic polyps: Secondary | ICD-10-CM

## 2017-09-10 HISTORY — PX: COLONOSCOPY: SHX5424

## 2017-09-10 SURGERY — COLONOSCOPY
Anesthesia: Moderate Sedation

## 2017-09-10 MED ORDER — SODIUM CHLORIDE 0.9% FLUSH
INTRAVENOUS | Status: AC
  Filled 2017-09-10: qty 10

## 2017-09-10 MED ORDER — SODIUM CHLORIDE 0.9 % IV SOLN
INTRAVENOUS | Status: DC
  Administered 2017-09-10: 1000 mL via INTRAVENOUS

## 2017-09-10 MED ORDER — STERILE WATER FOR IRRIGATION IR SOLN
Status: DC | PRN
  Administered 2017-09-10: 14:00:00

## 2017-09-10 MED ORDER — PROMETHAZINE HCL 25 MG/ML IJ SOLN: 25.0000 mg | Freq: Once | INTRAMUSCULAR | Status: DC

## 2017-09-10 MED ORDER — PROMETHAZINE HCL 25 MG/ML IJ SOLN
INTRAMUSCULAR | Status: AC
  Administered 2017-09-10: 25 mg
  Filled 2017-09-10: qty 1

## 2017-09-10 MED ORDER — ONDANSETRON HCL 4 MG/2ML IJ SOLN
INTRAMUSCULAR | Status: DC | PRN
  Administered 2017-09-10: 4 mg via INTRAVENOUS

## 2017-09-10 MED ORDER — MIDAZOLAM HCL 5 MG/5ML IJ SOLN
INTRAMUSCULAR | Status: AC
  Filled 2017-09-10: qty 10

## 2017-09-10 MED ORDER — MEPERIDINE HCL 100 MG/ML IJ SOLN
INTRAMUSCULAR | Status: DC | PRN
  Administered 2017-09-10 (×2): 50 mg via INTRAVENOUS
  Administered 2017-09-10: 25 mg via INTRAVENOUS

## 2017-09-10 MED ORDER — ONDANSETRON HCL 4 MG/2ML IJ SOLN
INTRAMUSCULAR | Status: AC
  Filled 2017-09-10: qty 2

## 2017-09-10 MED ORDER — MEPERIDINE HCL 100 MG/ML IJ SOLN
INTRAMUSCULAR | Status: AC
  Filled 2017-09-10: qty 2

## 2017-09-10 MED ORDER — MIDAZOLAM HCL 5 MG/5ML IJ SOLN
INTRAMUSCULAR | Status: DC | PRN
  Administered 2017-09-10 (×2): 2 mg via INTRAVENOUS
  Administered 2017-09-10: 1 mg via INTRAVENOUS

## 2017-09-10 NOTE — Discharge Instructions (Signed)
Colonoscopy Discharge Instructions  Read the instructions outlined below and refer to this sheet in the next few weeks. These discharge instructions provide you with general information on caring for yourself after you leave the hospital. Your doctor may also give you specific instructions. While your treatment has been planned according to the most current medical practices available, unavoidable complications occasionally occur. If you have any problems or questions after discharge, call Dr. Gala Romney at [phone]. ACTIVITY  You may resume your regular activity, but move at a slower pace for the next 24 hours.   Take frequent rest periods for the next 24 hours.   Walking will help get rid of the air and reduce the bloated feeling in your belly (abdomen).   No driving for 24 hours (because of the medicine (anesthesia) used during the test).    Do not sign any important legal documents or operate any machinery for 24 hours (because of the anesthesia used during the test).  NUTRITION  Drink plenty of fluids.   You may resume your normal diet as instructed by your doctor.   Begin with a light meal and progress to your normal diet. Heavy or fried foods are harder to digest and may make you feel sick to your stomach (nauseated).   Avoid alcoholic beverages for 24 hours or as instructed.  MEDICATIONS  You may resume your normal medications unless your doctor tells you otherwise.  WHAT YOU CAN EXPECT TODAY  Some feelings of bloating in the abdomen.   Passage of more gas than usual.   Spotting of blood in your stool or on the toilet paper.  IF YOU HAD POLYPS REMOVED DURING THE COLONOSCOPY:  No aspirin products for 7 days or as instructed.   No alcohol for 7 days or as instructed.   Eat a soft diet for the next 24 hours.  FINDING OUT THE RESULTS OF YOUR TEST Not all test results are available during your visit. If your test results are not back during the visit, make an appointment  with your caregiver to find out the results. Do not assume everything is normal if you have not heard from your caregiver or the medical facility. It is important for you to follow up on all of your test results.  SEEK IMMEDIATE MEDICAL ATTENTION IF:  You have more than a spotting of blood in your stool.   Your belly is swollen (abdominal distention).   You are nauseated or vomiting.   You have a temperature over 101.   You have abdominal pain or discomfort that is severe or gets worse throughout the day.    Colon polyp and hemorrhoid information provided  Begin Benefiber 1 tablespoon daily  Nitroglycerin ointment - 0.125% -apply a pea-sized amount to the anorectum 3 times a day as needed. Special concentration needs to be made up at [facility].  Minimize straining  Office visit with [place] in 6-8 weeks  Further recommendations to follow pending review of pathology report   Hemorrhoids Hemorrhoids are swollen veins in and around the rectum or anus. There are two types of hemorrhoids:  Internal hemorrhoids. These occur in the veins that are just inside the rectum. They may poke through to the outside and become irritated and painful.  External hemorrhoids. These occur in the veins that are outside of the anus and can be felt as a painful swelling or hard lump near the anus.  Most hemorrhoids do not cause serious problems, and they can be managed with home  treatments such as diet and lifestyle changes. If home treatments do not help your symptoms, procedures can be done to shrink or remove the hemorrhoids. What are the causes? This condition is caused by increased pressure in the anal area. This pressure may result from various things, including:  Constipation.  Straining to have a bowel movement.  Diarrhea.  Pregnancy.  Obesity.  Sitting for long periods of time.  Heavy lifting or other activity that causes you to strain.  Anal sex.  What are the signs or  symptoms? Symptoms of this condition include:  Pain.  Anal itching or irritation.  Rectal bleeding.  Leakage of stool (feces).  Anal swelling.  One or more lumps around the anus.  How is this diagnosed? This condition can often be diagnosed through a visual exam. Other exams or tests may also be done, such as:  Examination of the rectal area with a gloved hand (digital rectal exam).  Examination of the anal canal using a small tube (anoscope).  A blood test, if you have lost a significant amount of blood.  A test to look inside the colon (sigmoidoscopy or colonoscopy).  How is this treated? This condition can usually be treated at home. However, various procedures may be done if dietary changes, lifestyle changes, and other home treatments do not help your symptoms. These procedures can help make the hemorrhoids smaller or remove them completely. Some of these procedures involve surgery, and others do not. Common procedures include:  Rubber band ligation. Rubber bands are placed at the base of the hemorrhoids to cut off the blood supply to them.  Sclerotherapy. Medicine is injected into the hemorrhoids to shrink them.  Infrared coagulation. A type of light energy is used to get rid of the hemorrhoids.  Hemorrhoidectomy surgery. The hemorrhoids are surgically removed, and the veins that supply them are tied off.  Stapled hemorrhoidopexy surgery. A circular stapling device is used to remove the hemorrhoids and use staples to cut off the blood supply to them.  Follow these instructions at home: Eating and drinking  Eat foods that have a lot of fiber in them, such as whole grains, beans, nuts, fruits, and vegetables. Ask your health care provider about taking products that have added fiber (fiber supplements).  Drink enough fluid to keep your urine clear or pale yellow. Managing pain and swelling  Take warm sitz baths for 20 minutes, 3-4 times a day to ease pain and  discomfort.  If directed, apply ice to the affected area. Using ice packs between sitz baths may be helpful. ? Put ice in a plastic bag. ? Place a towel between your skin and the bag. ? Leave the ice on for 20 minutes, 2-3 times a day. General instructions  Take over-the-counter and prescription medicines only as told by your health care provider.  Use medicated creams or suppositories as told.  Exercise regularly.  Go to the bathroom when you have the urge to have a bowel movement. Do not wait.  Avoid straining to have bowel movements.  Keep the anal area dry and clean. Use wet toilet paper or moist towelettes after a bowel movement.  Do not sit on the toilet for long periods of time. This increases blood pooling and pain. Contact a health care provider if:  You have increasing pain and swelling that are not controlled by treatment or medicine.  You have uncontrolled bleeding.  You have difficulty having a bowel movement, or you are unable to have a bowel  movement.  You have pain or inflammation outside the area of the hemorrhoids. This information is not intended to replace advice given to you by your health care provider. Make sure you discuss any questions you have with your health care provider. Document Released: 12/04/2000 Document Revised: 05/06/2016 Document Reviewed: 08/21/2015 Elsevier Interactive Patient Education  2017 [employer].  Colon Polyps Polyps are tissue growths inside the body. Polyps can grow in many places, including the large intestine (colon). A polyp may be a round bump or a mushroom-shaped growth. You could have one polyp or several. Most colon polyps are noncancerous (benign). However, some colon polyps can become cancerous over time. What are the causes? The exact cause of colon polyps is not known. What increases the risk? This condition is more likely to develop in people who:  Have a family history of colon cancer or colon polyps.  Are  older than 52 or older than 45 if they are African American.  Have inflammatory bowel disease, such as ulcerative colitis or Crohn disease.  Are overweight.  Smoke cigarettes.  Do not get enough exercise.  Drink too much alcohol.  Eat a diet that is: ? High in fat and red meat. ? Low in fiber.  Had childhood cancer that was treated with abdominal radiation.  What are the signs or symptoms? Most polyps do not cause symptoms. If you have symptoms, they may include:  Blood coming from your rectum when having a bowel movement.  Blood in your stool.The stool may look dark red or black.  A change in bowel habits, such as constipation or diarrhea.  How is this diagnosed? This condition is diagnosed with a colonoscopy. This is a procedure that uses a lighted, flexible scope to look at the inside of your colon. How is this treated? Treatment for this condition involves removing any polyps that are found. Those polyps will then be tested for cancer. If cancer is found, your health care provider will talk to you about options for colon cancer treatment. Follow these instructions at home: Diet  Eat plenty of fiber, such as fruits, vegetables, and whole grains.  Eat foods that are high in calcium and vitamin D, such as milk, cheese, yogurt, eggs, liver, fish, and broccoli.  Limit foods high in fat, red meats, and processed meats, such as hot dogs, sausage, bacon, and lunch meats.  Maintain a healthy weight, or lose weight if recommended by your health care provider. General instructions  Do not smoke cigarettes.  Do not drink alcohol excessively.  Keep all follow-up visits as told by your health care provider. This is important. This includes keeping regularly scheduled colonoscopies. Talk to your health care provider about when you need a colonoscopy.  Exercise every day or as told by your health care provider. Contact a health care provider if:  You have new or worsening  bleeding during a bowel movement.  You have new or increased blood in your stool.  You have a change in bowel habits.  You unexpectedly lose weight. This information is not intended to replace advice given to you by your health care provider. Make sure you discuss any questions you have with your health care provider. Document Released: 09/02/2004 Document Revised: 05/14/2016 Document Reviewed: 10/28/2015 Elsevier Interactive Patient Education  [employer].

## 2017-09-10 NOTE — Interval H&P Note (Signed)
History and Physical Interval Note:  09/10/2017 1:31 PM  Kyle Atkins  has presented today for surgery, with the diagnosis of rectal pain/bleeding, family history colon polyp, family history colon cancer  The various methods of treatment have been discussed with the patient and family. After consideration of risks, benefits and other options for treatment, the patient has consented to  Procedure(s) with comments: COLONOSCOPY (N/A) - 2:00pm as a surgical intervention .  The patient's history has been reviewed, patient examined, no change in status, stable for surgery.  I have reviewed the patient's chart and labs.  Questions were answered to the patient's satisfaction.     Robert Rourk  No change. Diagnostic colonoscopy per plan. The risks, benefits, limitations, alternatives and imponderables have been reviewed with the patient. Questions have been answered. All parties are agreeable.

## 2017-09-10 NOTE — Op Note (Signed)
[facility] Patient Name: Kyle Atkins Procedure Date: 09/10/2017 1:22 PM MRN: [id number] Date of Birth: [birth year] Attending MD: Norvel Richards , MD CSN: [id number] Age: 41 Admit Type: Outpatient Procedure:                Colonoscopy Indications:              Hematochezia Providers:                Norvel Richards, MD, Janeece Riggers, RN, Rosina Lowenstein, RN Referring MD:              Medicines:                Midazolam 5 mg IV, Meperidine 937 mg IV Complications:            No immediate complications. Estimated Blood Loss:     Estimated blood loss was minimal. Procedure:                Pre-Anesthesia Assessment:                           - Prior to the procedure, a History and Physical                            was performed, and patient medications and                            allergies were reviewed. The patient's tolerance of                            previous anesthesia was also reviewed. The risks                            and benefits of the procedure and the sedation                            options and risks were discussed with the patient.                            All questions were answered, and informed consent                            was obtained. Prior Anticoagulants: The patient has                            taken no previous anticoagulant or antiplatelet                            agents. ASA Grade Assessment: II - A patient with                            mild systemic disease. After reviewing the risks  and benefits, the patient was deemed in                            satisfactory condition to undergo the procedure.                           After obtaining informed consent, the colonoscope                            was passed under direct vision. Throughout the                            procedure, the patient's blood pressure, pulse, and                            oxygen saturations were  monitored continuously. The                            EC-[id number] ([id number]) scope was introduced through                            the anus and advanced to the the cecum, identified                            by appendiceal orifice and ileocecal valve. The                            colonoscopy was performed without difficulty. The                            patient tolerated the procedure well. The quality                            of the bowel preparation was adequate. The quality                            of the bowel preparation was adequate. The terminal                            ileum, ileocecal valve, appendiceal orifice, and                            rectum were photographed. Scope In: 1:43:07 PM Scope Out: 1:57:47 PM Scope Withdrawal Time: 0 hours 10 minutes 35 seconds  Total Procedure Duration: 0 hours 14 minutes 40 seconds  Findings:      The perianal and digital rectal examinations were normal.      A 6 mm polyp was found in the sigmoid colon. The polyp was       semi-pedunculated. The polyp was removed with a cold snare. Resection       and retrieval were complete. Estimated blood loss was minimal.      Internal hemorrhoids were found during retroflexion. The hemorrhoids       were Grade I (internal hemorrhoids that do not prolapse).  The exam was otherwise without abnormality on direct and retroflexion       views. Distal 5 cm of terminal ileum mucosa also appeared normal. Impression:               - One 6 mm polyp in the sigmoid colon, removed with                            a cold snare. Resected and retrieved.                           - Internal hemorrhoids.                           - The examination was otherwise normal on direct                            and retroflexion views. Moderate Sedation:      Moderate (conscious) sedation was administered by the endoscopy nurse       and supervised by the endoscopist. The following parameters were       monitored:  oxygen saturation, heart rate, blood pressure, respiratory       rate, EKG, adequacy of pulmonary ventilation, and response to care.       Total physician intraservice time was 27 minutes. Recommendation:           - Patient has a contact number available for                            emergencies. The signs and symptoms of potential                            delayed complications were discussed with the                            patient. Return to normal activities tomorrow.                            Written discharge instructions were provided to the                            patient.                           - Resume previous diet.                           - Continue present medications. Avoid straining.                            Begin Benefiber 1 tablespoon daily. Begin                            nitroglycerin 0.125% compounded ointment?"apply to                            the anorectum twice a day.                           -  Repeat colonoscopy date to be determined after                            pending pathology results are reviewed for                            surveillance based on pathology results.                           - Return to GI clinic in 8 weeks. Procedure Code(s):        --- Professional ---                           [phone], Colonoscopy, flexible; with removal of                            tumor(s), polyp(s), or other lesion(s) by snare                            technique                           99152, Moderate sedation services provided by the                            same physician or other qualified health care                            professional performing the diagnostic or                            therapeutic service that the sedation supports,                            requiring the presence of an independent trained                            observer to assist in the monitoring of the                            patient's level of  consciousness and physiological                            status; initial 15 minutes of intraservice time,                            patient age 49 years or older                           [phone], Moderate sedation services; each additional                            15 minutes intraservice time Diagnosis Code(s):        --- Professional ---  D12.5, Benign neoplasm of sigmoid colon                           K64.0, First degree hemorrhoids                           K92.1, Melena (includes Hematochezia) CPT copyright 2016 American [employer] Association. All rights reserved. The codes documented in this report are preliminary and upon coder review may  be revised to meet current compliance requirements. Cristopher Estimable. Jekhi Bolin, MD Norvel Richards, MD 09/10/2017 2:24:15 PM This report has been signed electronically. Number of Addenda: 0

## 2017-09-10 NOTE — H&P (View-Only) (Signed)
Patient reports mother has had three colonoscopies, two of them have multiple colon polyps. Maternal aunt deceased in her 65s due to colon cancer.  Given FH and his rectal pain/bleeding, last TCS over five years ago, I would advise colonoscopy at this time.   If patient agreeable, please schedule TCS with RMR. Augment conscious sedation in phenergan 25mg  iv 45 minutes before.

## 2017-09-14 ENCOUNTER — Encounter: Payer: Self-pay | Admitting: Internal Medicine

## 2017-09-15 ENCOUNTER — Encounter (HOSPITAL_COMMUNITY): Payer: Self-pay | Admitting: Internal Medicine

## 2017-09-15 ENCOUNTER — Encounter: Payer: Self-pay | Admitting: Internal Medicine

## 2017-09-15 ENCOUNTER — Telehealth: Payer: Self-pay

## 2017-09-15 NOTE — Telephone Encounter (Signed)
Per RMR-  Rourk, Cristopher Estimable, MD  Claudina Lick, LPN; Theadora Rama        Send letter to patient.  Send copy of letter with path to referring provider and PCP.   Send patient a pamphlet on hemorrhoid banding.  Office visit with me in about 4-6 weeks.

## 2017-09-15 NOTE — Telephone Encounter (Signed)
Letter mailed to the pt with pamphlet.

## 2017-09-15 NOTE — Telephone Encounter (Signed)
OV made and letter mailed °

## 2017-10-12 ENCOUNTER — Encounter: Payer: Self-pay | Admitting: Internal Medicine

## 2017-10-12 ENCOUNTER — Ambulatory Visit (INDEPENDENT_AMBULATORY_CARE_PROVIDER_SITE_OTHER): Payer: Managed Care, Other (non HMO) | Admitting: Internal Medicine

## 2017-10-12 VITALS — BP 135/80 | HR 64 | Temp 98.0°F | Ht 69.0 in | Wt 177.4 lb

## 2017-10-12 DIAGNOSIS — K648 Other hemorrhoids: Secondary | ICD-10-CM

## 2017-10-12 NOTE — Patient Instructions (Signed)
Avoid straining.  Benefiber 1 tablespoon twice daily  Limit toilet time to 5 minutes  Call with any interim problems  Schedule followup appointment in 4 weeks from now   

## 2017-10-12 NOTE — Progress Notes (Signed)
[place] banding procedure note:  The patient presents with symptomatic grade 1 hemorrhoids, unresponsive to maximal medical therapy, requesting rubber band ligation of his hemorrhoidal disease. All risks, benefits, and alternative forms of therapy were described and informed consent was obtained.  In the left lateral decubitus position, DRE utilizing nitroglycerin and Xylocaine as lubricant, performed. Normal findings. No external lesions.  The decision was made to band the right anterior internal hemorrhoid;  the [place] was used to perform band ligation without complication. Digital anorectal examination was then performed to assure proper positioning of the band; band found to be in excellent position. No pinching or pain. The patient was discharged home without pain or other issues. Dietary and behavioral recommendations were given  along with follow-up instructions. The patient will return in 4 weeks for followup and possible additional banding as required.  No complications were encountered and the patient tolerated the procedure well.  Patient advised to take Benefiber 1 tablespoon twice daily. See discharge instructions.

## 2017-11-09 ENCOUNTER — Ambulatory Visit (INDEPENDENT_AMBULATORY_CARE_PROVIDER_SITE_OTHER): Payer: Managed Care, Other (non HMO) | Admitting: Internal Medicine

## 2017-11-09 ENCOUNTER — Encounter: Payer: Self-pay | Admitting: Internal Medicine

## 2017-11-09 VITALS — BP 125/74 | HR 80 | Temp 97.4°F | Ht 69.0 in | Wt 177.2 lb

## 2017-11-09 DIAGNOSIS — K648 Other hemorrhoids: Secondary | ICD-10-CM | POA: Diagnosis not present

## 2017-11-09 NOTE — Patient Instructions (Signed)
Avoid straining.  Continue fiber supplement daily daily  Limit total time of 5 minutes  Call with any interim problems  Schedule followup appointment in about 6 weeks from now. May or may not need another banding.

## 2017-11-09 NOTE — Progress Notes (Signed)
.  [place] banding procedure note:  The patient presents with symptomatic hemorrhoids, unresponsive to maximal medical therapy;  Status post banding of the right anterior hemorrhoid column previously. He states it globally, his hemorrhoid symptoms have improved but have not totally resolved. Is taking fiber daily and trying not to strain.   All risks, benefits, and alternative forms of therapy were described and informed consent was obtained.  In the left lateral decubitus position, a DRE revealed no abnormalities. Lubricant included nitroglycerin ointment and topical Xylocaine. The decision was made to band the left lateral internal hemorrhoid;  the [place] was used to perform band ligation without complication. Digital anorectal examination was then performed to assure proper positioning of the band;  Band found to be in excellent position without pinching or pain. I elected to go ahead and put a band on the right posterior hemorrhoid in similar fashion. Follow-up DRE demonstrated no pinching or pain. The patient was discharged home without pain or other issues. Dietary and behavioral recommendations were given. The patient will return in 6 weeks for followup and possible additional banding as required.  No complications were encountered and the patient tolerated the procedure well.

## 2017-12-28 ENCOUNTER — Encounter: Payer: Self-pay | Admitting: Internal Medicine

## 2017-12-28 ENCOUNTER — Ambulatory Visit: Payer: Managed Care, Other (non HMO) | Admitting: Internal Medicine

## 2017-12-28 VITALS — BP 112/68 | HR 62 | Temp 97.0°F | Ht 69.0 in | Wt 180.0 lb

## 2017-12-28 DIAGNOSIS — K648 Other hemorrhoids: Secondary | ICD-10-CM | POA: Diagnosis not present

## 2017-12-28 NOTE — Patient Instructions (Signed)
Avoid straining.  Benefiber 1 tablespoon twice daily  Limit toilet time to 5 minutes  Call with any interim problems  Schedule followup appointment in 3 months from now

## 2017-12-28 NOTE — Progress Notes (Signed)
[place] banding procedure note:  The patient presents with symptomatic hemorrhoids. He is status post banding of the right posterior anterior and left lateral hemorrhoid columns over the past 2 sessions. He states pain has resolved. Only a scant amount of bleeding. He feels that he needs one more session today. All risks, benefits, and alternative forms of therapy were described and informed consent was obtained.  In the left lateral decubitus position, DRE utilizing nitroglycerin and Xylocaine as lubricant performed. No abnormalities appreciated.  The decision was made to band the anterior midline hemorrhoid tissue ;  the [place] was used to perform band ligation without complication. Digital anorectal examination was then performed to assure proper positioning of the band, band in place with a relatively modest amount of hemorrhoid tissue engaged. No pinching or pain. I elected to go ahead and place a band in the neutral position. This was done in similar fashion. This was engaged on the left side upon follow-up DRE. Again no pinching or pain. Finally, I elected to go ahead and put a third band on the right lateral area. This was done without pinching her pain and DRE demonstrating good positioning.  Dietary and behavioral recommendations were given.  The patient will return in 3 months  No complications were encountered and the patient tolerated the procedure well.

## 2018-02-22 ENCOUNTER — Encounter: Payer: Self-pay | Admitting: Internal Medicine

## 2019-05-04 ENCOUNTER — Ambulatory Visit (INDEPENDENT_AMBULATORY_CARE_PROVIDER_SITE_OTHER): Payer: Managed Care, Other (non HMO) | Admitting: Internal Medicine

## 2019-09-19 ENCOUNTER — Ambulatory Visit (INDEPENDENT_AMBULATORY_CARE_PROVIDER_SITE_OTHER): Payer: Managed Care, Other (non HMO) | Admitting: Internal Medicine

## 2019-09-19 ENCOUNTER — Encounter (INDEPENDENT_AMBULATORY_CARE_PROVIDER_SITE_OTHER): Payer: Self-pay | Admitting: Internal Medicine

## 2019-09-19 ENCOUNTER — Other Ambulatory Visit: Payer: Self-pay

## 2019-09-19 VITALS — BP 140/80 | HR 72 | Ht 69.0 in | Wt 167.4 lb

## 2019-09-19 DIAGNOSIS — F4310 Post-traumatic stress disorder, unspecified: Secondary | ICD-10-CM

## 2019-09-19 DIAGNOSIS — E785 Hyperlipidemia, unspecified: Secondary | ICD-10-CM | POA: Insufficient documentation

## 2019-09-19 DIAGNOSIS — E559 Vitamin D deficiency, unspecified: Secondary | ICD-10-CM | POA: Insufficient documentation

## 2019-09-19 DIAGNOSIS — R6882 Decreased libido: Secondary | ICD-10-CM | POA: Insufficient documentation

## 2019-09-19 DIAGNOSIS — L989 Disorder of the skin and subcutaneous tissue, unspecified: Secondary | ICD-10-CM | POA: Diagnosis not present

## 2019-09-19 HISTORY — DX: Vitamin D deficiency, unspecified: E55.9

## 2019-09-19 HISTORY — DX: Hyperlipidemia, unspecified: E78.5

## 2019-09-19 HISTORY — DX: Decreased libido: R68.82

## 2019-09-19 NOTE — Progress Notes (Signed)
   Wellness Office Visit  Subjective:  Patient ID: Kyle Atkins, male    DOB: 20-Jun-1976  Age: 43 y.o. MRN: [id number]  CC: This man comes in as an acute visit with 2 main concerns.  He has some lesions on his scalp and also he wants a referral to psychiatry/psychotherapy. HPI  He has noticed scalp lesions which have been present for the last couple of weeks.  He noticed this once he had shaved his head.  He would like to see a dermatologist for further evaluation. He also wants to see a psychiatrist/psychotherapist because of what I am sure his PTSD from the loss of his 66-year-old son approximately 3 years ago. Past Medical History:  Diagnosis Date  . Decreased libido 09/19/2019  . HLD (hyperlipidemia) 09/19/2019  . Vitamin D deficiency disease 09/19/2019      Family History  Problem Relation Age of Onset  . Heart disease Father 31  . Colon cancer Maternal Aunt        34s  . Colon polyps Mother     Social History   Social History Narrative   Married 15 years.Works at [employer].     Current Meds  Medication Sig  . Cholecalciferol (VITAMIN D-3) 125 MCG (5000 UT) TABS Take 1 tablet by mouth daily.  . Testosterone Cypionate 200 MG/ML SOLN Inject 100 mg as directed 2 (two) times a week.       Objective:   Today's Vitals: BP 140/80   Pulse 72   Ht 5\' 9"  (1.753 m)   Wt 167 lb 6.4 oz (75.9 kg)   BMI 24.72 kg/m  Vitals with BMI 09/19/2019 12/28/2017 11/09/2017  Height 5\' 9"  5\' 9"  5\' 9"   Weight 167 lbs 6 oz 180 lbs 177 lbs 3 oz  BMI 24.71 [id number] 0000000  Systolic [id number] 0000000  Diastolic 80 68 74  Pulse 72 62 80     Physical Exam    He looks systemically well.  His systolic blood pressure is elevated today.  He is somewhat stressed after an argument with his wife.  There does appear to be 2 or 3 scalp lesions on the right side parietal scalp, I think there probably sebaceous cyst but I am not sure.   Assessment   1. Scalp lesion   2. PTSD (post-traumatic  stress disorder)       Tests ordered Orders Placed This Encounter  Procedures  . Ambulatory referral to Dermatology  . Ambulatory referral to Psychiatry     Plan: 1. I will refer him to Dr. Nevada Crane, dermatologist in [place] for the scalp lesions. 2. I will also refer him to psychiatry for his PTSD.     Doree Albee, MD

## 2019-10-11 ENCOUNTER — Other Ambulatory Visit (INDEPENDENT_AMBULATORY_CARE_PROVIDER_SITE_OTHER): Payer: Self-pay | Admitting: Internal Medicine

## 2019-10-11 ENCOUNTER — Encounter (INDEPENDENT_AMBULATORY_CARE_PROVIDER_SITE_OTHER): Payer: Self-pay | Admitting: Internal Medicine

## 2019-10-11 DIAGNOSIS — F4310 Post-traumatic stress disorder, unspecified: Secondary | ICD-10-CM

## 2019-10-12 ENCOUNTER — Other Ambulatory Visit (INDEPENDENT_AMBULATORY_CARE_PROVIDER_SITE_OTHER): Payer: Self-pay

## 2019-10-12 DIAGNOSIS — F4310 Post-traumatic stress disorder, unspecified: Secondary | ICD-10-CM

## 2019-10-23 ENCOUNTER — Other Ambulatory Visit (INDEPENDENT_AMBULATORY_CARE_PROVIDER_SITE_OTHER): Payer: Self-pay | Admitting: Internal Medicine

## 2019-10-23 DIAGNOSIS — F4310 Post-traumatic stress disorder, unspecified: Secondary | ICD-10-CM

## 2019-10-26 ENCOUNTER — Telehealth: Payer: Self-pay

## 2019-10-26 NOTE — Telephone Encounter (Signed)
Pt was called and given information of office referral. He finally heard from [facility] office ;but he said he will try both.

## 2019-10-26 NOTE — Telephone Encounter (Signed)
HIM sent to [facility] services.  Dr Maurie Boettcher  Release:  [phone]

## 2019-11-03 ENCOUNTER — Ambulatory Visit (INDEPENDENT_AMBULATORY_CARE_PROVIDER_SITE_OTHER): Payer: 59 | Admitting: Licensed Clinical Social Worker

## 2019-11-03 ENCOUNTER — Other Ambulatory Visit: Payer: Self-pay

## 2019-11-03 DIAGNOSIS — F338 Other recurrent depressive disorders: Secondary | ICD-10-CM | POA: Diagnosis not present

## 2019-11-03 DIAGNOSIS — F4321 Adjustment disorder with depressed mood: Secondary | ICD-10-CM

## 2019-11-04 ENCOUNTER — Encounter (HOSPITAL_COMMUNITY): Payer: Self-pay | Admitting: Licensed Clinical Social Worker

## 2019-11-04 NOTE — Progress Notes (Signed)
Comprehensive Clinical Assessment (CCA) Note  11/04/2019 Kyle Atkins [id number]  Visit Diagnosis:      ICD-10-CM   1. Other recurrent depressive disorders ([place])  F33.8   2. Grief  F43.21       CCA Part One  Part One has been completed on paper by the patient.  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 [employer]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, working out Type of Services Patient Feels Are Needed: Therapy Initial Clinical Notes/Concerns: Symptoms were present in teens but increased after he lost his son 3 years ago, symptoms occur daily, symptoms are moderate to severe per patient  Mental Health Symptoms Depression:  Depression: Irritability, Difficulty Concentrating, Tearfulness, Change in energy/activity, Fatigue  Mania:  Mania: N/A  Anxiety:   Anxiety: N/A  Psychosis:  Psychosis: N/A  Trauma:  Trauma: N/A  Obsessions:  Obsessions: N/A  Compulsions:  Compulsions: N/A  Inattention:  Inattention: N/A  Hyperactivity/Impulsivity:  Hyperactivity/Impulsivity: N/A  Oppositional/Defiant Behaviors:  Oppositional/Defiant Behaviors: N/A  Borderline Personality:  Emotional Irregularity: N/A  Other Mood/Personality Symptoms:  Other Mood/Personality Symtpoms: N/A   Mental Status Exam Appearance and self-care  Stature:  Stature: Average  Weight:  Weight: Average weight  Clothing:  Clothing: Casual  Grooming:   Grooming: Normal  Cosmetic use:  Cosmetic Use: None  Posture/gait:  Posture/Gait: Normal  Motor activity:  Motor Activity: Not Remarkable  Sensorium  Attention:  Attention: Normal  Concentration:  Concentration: Normal  Orientation:  Orientation: X5  Recall/memory:  Recall/Memory: Normal  Affect and Mood  Affect:  Affect: Depressed  Mood:  Mood: Depressed  Relating  Eye contact:  Eye Contact: Normal  Facial expression:  Facial Expression: Responsive  Attitude toward examiner:  Attitude Toward Examiner: Cooperative  Thought and Language  Speech flow: Speech Flow: Normal  Thought content:  Thought Content: Appropriate to mood and circumstances  Preoccupation:  Preoccupations: (N/A)  Hallucinations:  Hallucinations: (N/A)  Organization:   Logical  Transport planner of Knowledge:  Fund of Knowledge: Average  Intelligence:  Intelligence: Average  Abstraction:  Abstraction: Normal  Judgement:  Judgement: Normal  Reality Testing:  Reality Testing: Adequate  Insight:  Insight: Fair  Decision Making:  Decision Making: Normal  Social Functioning  Social Maturity:  Social Maturity: Responsible  Social Judgement:  Social Judgement: Normal  Stress  Stressors:  Stressors: Brewing technologist, Transitions  Coping Ability:  Coping Ability: English as a second language teacher Deficits:   Managing stress  Supports:   Family   Family and Psychosocial History: Family history Marital status: Married Number of Years Married: 50 What types of issues is patient dealing with in the relationship?: Arguments about a one night stand he had when they first started dating Are you sexually active?: Yes What is your sexual orientation?: Heterosexual Has your sexual activity been affected by drugs, alcohol, medication, or emotional stress?: Emotional stress Does patient have children?: Yes How many children?: 2 How is patient's relationship with their children?: Sons, Jax passed away at age 74, Axl is 71  months  old  Childhood History:  Childhood History Additional childhood history information: Patient describes childhood as "pretty good." Parents got divorced when he was 6. Description of patient's relationship with caregiver when they were a child: Mother: Good,  Father: Good Patient's description of current relationship with people who raised him/her: Mother: Good,   Father: Good How were you disciplined when you got in trouble as a child/adolescent?: Spanked, talked to, grounded, things taken away Does patient have siblings?: Yes Number of Siblings: 5 Description of patient's current relationship with siblings: 4 brothers, 1 sister: Fine relationship Did patient suffer any verbal/emotional/physical/sexual abuse as a child?: No Did patient suffer from severe childhood neglect?: No Has patient ever been sexually abused/assaulted/raped as an adolescent or adult?: No Was the patient ever a victim of a crime or a disaster?: No Witnessed domestic violence?: No Has patient been effected by domestic violence as an adult?: No  CCA Part Two B  Employment/Work Situation: Employment / Work Copywriter, advertising Employment situation: Employed 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 How long has patient been employed?: 19 Patient's job has been impacted by current illness: No What is the longest time patient has a held a job?: 19 years 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 Are There Guns or Chiropractor in [place]?: No  Education: Museum/gallery curator Currently Attending: N/A Last Grade Completed: 11 Name of [employer]: [employer] Did [employer] Graduate From [employer]?: (Got his GED in the [place]) Did [place]?: No Did You Attend Graduate School?: No Did You Have Any Special Interests In School?: Art, Gym Did You Have An Individualized Education Program (IIEP): Yes(Reading) Did You Have Any Difficulty At School?:  No  Religion: Religion/Spirituality Are You A Religious Person?: Yes What is Your Religious Affiliation?: Unknown How Might This Affect Treatment?: Support in treatment  Leisure/Recreation: Leisure / Recreation Leisure and Hobbies: Workout, work on cars  Exercise/Diet: Exercise/Diet Do You Exercise?: No Have You Gained or Lost A Significant Amount of Weight in the Past Six Months?: No Do You Follow a Special Diet?: No Do You Have Any Trouble Sleeping?: No  CCA Part Two C  Alcohol/Drug Use: Alcohol / Drug Use Pain Medications: Denies Prescriptions: Denies Over the Counter: Denies History of alcohol / drug use?: Yes Substance #1 Name of Substance 1: Alcohol 1 - Age of First Use: 14 1 - Amount (size/oz): 3 or 4 shots of liquor 1 - Frequency: Varies (can go months) 1 - Duration: 12 months 1 - Last Use / Amount: A month ago                    CCA Part Three  ASAM's:  Six Dimensions of Multidimensional Assessment  Dimension 1:  Acute Intoxication and/or Withdrawal Potential:  Dimension 1:  Comments: None  Dimension 2:  Biomedical Conditions and Complications:  Dimension 2:  Comments: None  Dimension 3:  Emotional, Behavioral, or Cognitive Conditions and Complications:  Dimension 3:  Comments: None  Dimension 4:  Readiness to Change:  Dimension 4:  Comments: None  Dimension 5:  Relapse, Continued use, or Continued Problem Potential:  Dimension 5:  Comments: None  Dimension 6:  Recovery/Living Environment:  Dimension 6:  Recovery/Living Environment Comments: None   Substance use Disorder (SUD)    Social Function:  Social Functioning Social Maturity: Responsible Social Judgement: Normal  Stress:  Stress Stressors: Grief/losses, Transitions Coping Ability: Overwhelmed Patient Takes Medications The Way The Doctor  Instructed?: NA Priority Risk: Low Acuity  Risk Assessment- Self-Harm Potential: Risk Assessment For Self-Harm Potential Thoughts of Self-Harm: No  current thoughts Method: No plan Availability of Means: No access/NA  Risk Assessment -Dangerous to Others Potential: Risk Assessment For Dangerous to Others Potential Method: No Plan Availability of Means: No access or NA Intent: Vague intent or NA Notification Required: No need or identified person  DSM5 Diagnoses: Patient Active Problem List   Diagnosis Date Noted  . HLD (hyperlipidemia) 09/19/2019  . Vitamin D deficiency disease 09/19/2019  . Decreased libido 09/19/2019  . Rectal pain 08/20/2017  . FHx: colon cancer 08/20/2017  . Hemorrhoids 02/09/2012  . Rectal bleed 02/09/2012  . BICEPS TENDINITIS, RIGHT 10/14/2009  . SHOULDER PAIN 04/30/2009  . LATERAL EPICONDYLITIS 04/30/2009    Patient Centered Plan: Patient is on the following Treatment Plan(s):  Depression  Recommendations for Services/Supports/Treatments: Recommendations for Services/Supports/Treatments Recommendations For Services/Supports/Treatments: Individual Therapy  Treatment Plan Summary: OP Treatment Plan Summary: Kyle Atkins will manage mood as evidenced by "stop being so short with my people," improve communication, express emotion appropriately, and cope with stressors for 5 out of 7 days for 60 days.   Referrals to Alternative Service(s): Referred to Alternative Service(s):   Place:   Date:   Time:    Referred to Alternative Service(s):   Place:   Date:   Time:    Referred to Alternative Service(s):   Place:   Date:   Time:    Referred to Alternative Service(s):   Place:   Date:   Time:     Glori Bickers, LCSW

## 2019-11-07 ENCOUNTER — Ambulatory Visit (INDEPENDENT_AMBULATORY_CARE_PROVIDER_SITE_OTHER): Payer: 59 | Admitting: Internal Medicine

## 2019-11-07 ENCOUNTER — Encounter (INDEPENDENT_AMBULATORY_CARE_PROVIDER_SITE_OTHER): Payer: Self-pay | Admitting: Internal Medicine

## 2019-11-07 ENCOUNTER — Other Ambulatory Visit: Payer: Self-pay

## 2019-11-07 VITALS — BP 130/78 | HR 64 | Ht 69.0 in | Wt 171.2 lb

## 2019-11-07 DIAGNOSIS — E559 Vitamin D deficiency, unspecified: Secondary | ICD-10-CM | POA: Diagnosis not present

## 2019-11-07 DIAGNOSIS — E782 Mixed hyperlipidemia: Secondary | ICD-10-CM | POA: Diagnosis not present

## 2019-11-07 DIAGNOSIS — R6882 Decreased libido: Secondary | ICD-10-CM

## 2019-11-07 DIAGNOSIS — Z114 Encounter for screening for human immunodeficiency virus [HIV]: Secondary | ICD-10-CM

## 2019-11-07 DIAGNOSIS — Z0001 Encounter for general adult medical examination with abnormal findings: Secondary | ICD-10-CM | POA: Diagnosis not present

## 2019-11-07 NOTE — Progress Notes (Signed)
Chief Complaint: This 43 year old man comes in for an annual physical exam and to address his chronic conditions which are described below. HPI: He has been on testosterone therapy for more than a year for symptoms of decreased levels with decreased libido, lack of energy.  I think testosterone therapy is really helped him feel better overall. He also continues with vitamin D3 supplementation for vitamin D deficiency. More recently has had worsening symptoms of his PTSD.  He had a 5-year-old son that died and he has been dealing with this for some time.  He finally was able to see a counselor/therapist this month and will follow up with this.  Past Medical History:  Diagnosis Date  . Decreased libido 09/19/2019  . HLD (hyperlipidemia) 09/19/2019  . Vitamin D deficiency disease 09/19/2019   Past Surgical History:  Procedure Laterality Date  . BACK SURGERY     lower, X2  . COLONOSCOPY  02/15/2012   Dr. Gala Romney, internal hemorrhoids, hyperplastic polyp.   . COLONOSCOPY N/A 09/10/2017   Procedure: COLONOSCOPY;  Surgeon: Daneil Dolin, MD;  Location: [facility];  Service: Endoscopy;  Laterality: N/A;  2:00pm  . HAND SURGERY     right     Social History   Social History Narrative   Married 15 years.Works at [employer].    Social History   Tobacco Use  . Smoking status: Former Research scientist (life sciences)  . Smokeless tobacco: Never Used  . Tobacco comment: only as teenager  Substance Use Topics  . Alcohol use: Yes    Comment: socially, once per month      Allergies: No Known Allergies   Current Meds  Medication Sig  . Cholecalciferol (VITAMIN D-3) 125 MCG (5000 UT) TABS Take 1 tablet by mouth daily.  . Testosterone Cypionate 200 MG/ML SOLN Inject 100 mg as directed 2 (two) times a week.     Nutrition He tries to eat healthy overall.  Sleep Adequate sleep.  Exercise Exercise on a regular basis with a combination of strength training mostly and some cardio.  Bio-identical  hormones Testosterone therapy is being used off label for symptoms of testosterone deficiency and benefits that it produces based on several studies.  These benefits include decreasing body fat, increasing in lean muscle mass and increasing in bone density.  There is improvement of memory, cognition.  There is improvement in exercise tolerance and endurance.  Testosterone therapy has also been shown to be protective against coronary artery disease, cerebrovascular disease, diabetes, hypertension and degenerative joint disease. I have discussed with the patient the FDA warnings regarding testosterone therapy, benefits and side effects and modes of administration as well as monitoring blood levels and side effects  on a regular basis The patient is agreeable that testosterone therapy should be an integral part of his/her wellness,quality of life and prevention of chronic disease.  QPY:PPJKD from the symptoms mentioned above,there are no other symptoms referable to all systems reviewed.  Physical Exam: Blood pressure 130/78, pulse 64, height 5' 9" (1.753 m), weight 171 lb 3.2 oz (77.7 kg). Vitals with BMI 11/07/2019 09/19/2019 12/28/2017  Height 5' 9" 5' 9" 5' 9"  Weight 171 lbs 3 oz 167 lbs 6 oz 180 lbs  BMI 25.27 32.67 12.45  Systolic 809 983 382  Diastolic 78 80 68  Pulse 64 72 62      He looks systemically well. General: Alert, cooperative, and appears to be the stated age.No pallor.  No jaundice.  No clubbing. Head: Normocephalic Eyes: Sclera  white, pupils equal and reactive to light, red reflex x 2,  Ears: Normal bilaterally Oral cavity: Lips, mucosa, and tongue normal: Teeth and gums normal Neck: No adenopathy, supple, symmetrical, trachea midline, and thyroid does not appear enlarged Respiratory: Clear to auscultation bilaterally.No wheezing, crackles or bronchial breathing. Cardiovascular: Heart sounds are present and appear to be normal without murmurs or added sounds.  No carotid  bruits.  Peripheral pulses are present and equal bilaterally.: Gastrointestinal:positive bowel sounds, no hepatosplenomegaly.  No masses felt.No tenderness. Skin: Clear, No rashes noted.No worrisome skin lesions seen. Neurological: Grossly intact without focal findings, cranial nerves II through XII intact, muscle strength equal bilaterally Musculoskeletal: No acute joint abnormalities noted.Full range of movement noted with joints. Psychiatric: Affect appropriate, non-anxious.    Assessment  1. Mixed hyperlipidemia   2. Vitamin D deficiency disease   3. Decreased libido   4. Encounter for screening for HIV   5. Encounter for general adult medical examination with abnormal findings     Tests Ordered:   Orders Placed This Encounter  Procedures  . CBC  . CMP with eGFR([facility])  . Hemoglobin A1c  . Lipid Panel  . T3, Free  . TSH  . Testosterone Total,Free,Bio, Males  . Vitamin D, 25-hydroxy     Plan  1. Blood work is ordered as above. 2. He has not required statin therapy for his hyperlipidemia and we will see what the numbers look like now. 3. He will continue with vitamin D3 supplementation for vitamin D deficiency. 4. He will continue with testosterone therapy as before which he seems to tolerate. 5. Further recommendations will depend on blood results and I will see him in 6 months time for follow-up.  Tdap vaccination was given today. 6. Today, in addition to a preventative visit, I performed an office visit to address his chronic conditions and symptoms above.     No orders of the defined types were placed in this encounter.    Eldra Word C Jeffrie Lofstrom   11/07/2019, 9:05 AM

## 2019-11-08 ENCOUNTER — Encounter (INDEPENDENT_AMBULATORY_CARE_PROVIDER_SITE_OTHER): Payer: Self-pay | Admitting: Internal Medicine

## 2019-11-08 LAB — LIPID PANEL
Cholesterol: 180 mg/dL (ref <200)
HDL: 57 mg/dL (ref >=40)
LDL Cholesterol (Calc): 102 mg/dL (calc) — ABNORMAL HIGH
Non-HDL Cholesterol (Calc): 123 mg/dL (calc) (ref <130)
Total CHOL/HDL Ratio: 3.2 (calc) (ref <5.0)
Triglycerides: 117 mg/dL (ref <150)

## 2019-11-08 LAB — COMPLETE METABOLIC PANEL WITH GFR
AG Ratio: 1.6 (calc) (ref 1.0–2.5)
ALT: 14 U/L (ref 9–46)
AST: 14 U/L (ref 10–40)
Albumin: 4.1 g/dL (ref 3.6–5.1)
Alkaline phosphatase (APISO): 52 U/L (ref 36–130)
BUN: 10 mg/dL (ref 7–25)
CO2: 27 mmol/L (ref 20–32)
Calcium: 9.3 mg/dL (ref 8.6–10.3)
Chloride: 103 mmol/L (ref 98–110)
Creat: 1.21 mg/dL (ref 0.60–1.35)
GFR, Est African American: 85 mL/min/{1.73_m2} (ref >=60)
GFR, Est Non African American: 73 mL/min/{1.73_m2} (ref >=60)
Globulin: 2.5 g/dL (calc) (ref 1.9–3.7)
Glucose, Bld: 80 mg/dL (ref 65–99)
Potassium: 4.5 mmol/L (ref 3.5–5.3)
Sodium: 138 mmol/L (ref 135–146)
Total Bilirubin: 2.3 mg/dL — ABNORMAL HIGH (ref 0.2–1.2)
Total Protein: 6.6 g/dL (ref 6.1–8.1)

## 2019-11-08 LAB — HEMOGLOBIN A1C
Hgb A1c MFr Bld: 5.1 % of total Hgb (ref <5.7)
Mean Plasma Glucose: 100 (calc)
eAG (mmol/L): 5.5 (calc)

## 2019-11-08 LAB — CBC
HCT: 48.3 % (ref 38.5–50.0)
Hemoglobin: 16.6 g/dL (ref 13.2–17.1)
MCH: 31.9 pg (ref 27.0–33.0)
MCHC: 34.4 g/dL (ref 32.0–36.0)
MCV: 92.9 fL (ref 80.0–100.0)
MPV: 10.8 fL (ref 7.5–12.5)
Platelets: 192 10*3/uL (ref 140–400)
RBC: 5.2 10*6/uL (ref 4.20–5.80)
RDW: 11.9 % (ref 11.0–15.0)
WBC: 6.6 10*3/uL (ref 3.8–10.8)

## 2019-11-08 LAB — TSH: TSH: 2.47 mIU/L (ref 0.40–4.50)

## 2019-11-08 LAB — TESTOSTERONE TOTAL,FREE,BIO, MALES
Albumin: 4.1 g/dL (ref 3.6–5.1)
Sex Hormone Binding: 12 nmol/L (ref 10–50)
Testosterone, Bioavailable: 789.8 ng/dL — ABNORMAL HIGH (ref >=110.0)
Testosterone, Free: 419.5 pg/mL — ABNORMAL HIGH (ref 46.0–224.0)
Testosterone: 1200 ng/dL — ABNORMAL HIGH (ref 250–827)

## 2019-11-08 LAB — HIV ANTIBODY (ROUTINE TESTING W REFLEX): HIV 1&2 Ab, 4th Generation: NONREACTIVE

## 2019-11-08 LAB — VITAMIN D 25 HYDROXY (VIT D DEFICIENCY, FRACTURES): Vit D, 25-Hydroxy: 30 ng/mL (ref 30–100)

## 2019-11-08 LAB — T3, FREE: T3, Free: 3.6 pg/mL (ref 2.3–4.2)

## 2019-12-12 ENCOUNTER — Other Ambulatory Visit: Payer: Self-pay

## 2019-12-12 ENCOUNTER — Ambulatory Visit (HOSPITAL_COMMUNITY): Payer: 59 | Admitting: Licensed Clinical Social Worker

## 2019-12-12 ENCOUNTER — Ambulatory Visit (INDEPENDENT_AMBULATORY_CARE_PROVIDER_SITE_OTHER): Payer: 59 | Admitting: Clinical

## 2019-12-12 DIAGNOSIS — F338 Other recurrent depressive disorders: Secondary | ICD-10-CM | POA: Diagnosis not present

## 2019-12-12 DIAGNOSIS — F4321 Adjustment disorder with depressed mood: Secondary | ICD-10-CM | POA: Diagnosis not present

## 2019-12-13 NOTE — Progress Notes (Signed)
Virtual Visit via Telephone Note  I connected with Krithik Mapel Chagnon on 12/13/19 at  2:00 PM EST by telephone and verified that I am speaking with the correct person using two identifiers.  Location: Patient: Home Provider: Office   I discussed the limitations, risks, security and privacy concerns of performing an evaluation and management service by telephone and the availability of in person appointments. I also discussed with the patient that there may be a patient responsible charge related to this service. The patient expressed understanding and agreed to proceed.        THERAPIST PROGRESS NOTE  Session Time: 2:00PM-2:45PM  Participation Level: Active  Behavioral Response: CasualAlertDepressed  Type of Therapy: Individual Therapy  Treatment Goals addressed: Coping  Interventions: CBT  Summary: Kyle Atkins is a 43 y.o. male who presents with Depressed Mood and Grief. The OPT therapist met with the patient for his initial scheduled appointment with the [place] therapist. The OPT therapist utilized Motivational Interviewing to work with the patient in establishing therapeutic repore. The patient in this session worked in collaboration with the [place] provider giving his feedback about the past week and identifying triggers to his mental health symptoms. The OPT therapist worked with the patient providing Cognitive Behavioral Therapy while reviewing the clients thoughts, feelings, and behaviors related to an identified triggering event.  The OPT therapist assessed lethality with the patient reporting no current S/I or H/I.  Suicidal/Homicidal: Negativewithout intent/plan  Therapist Response:  The OPT therapist met with the patient for his inital scheduled OPT session. The patient was responsive/collaborative/ and actively involved in his session. The patient gave feedback about current triggers including ongoing grief for the loss of a child and current marital discord with his partner  related to a infidelity several years ago. The OPT therapist provided CBT as treatment during the session as well as worked with the patient reviewing coping strategies. The OPT therapist will continue treatment work with the patient in his next scheduled session.   Plan: Return again in 2 weeks.  Diagnosis: Axis I: Other recurrent depressive disorders F.33.8    Axis II: [place] F43.21  I discussed the assessment and treatment plan with the patient. The patient was provided an opportunity to ask questions and all were answered. The patient agreed with the plan and demonstrated an understanding of the instructions.   The patient was advised to call back or seek an in-person evaluation if the symptoms worsen or if the condition fails to improve as anticipated.  I provided 45 minutes of non-face-to-face time during this encounter.    Lennox Grumbles, LCSW 12/13/2019

## 2020-02-20 ENCOUNTER — Telehealth (HOSPITAL_COMMUNITY): Payer: Self-pay | Admitting: Clinical

## 2020-02-20 ENCOUNTER — Other Ambulatory Visit: Payer: Self-pay

## 2020-02-20 ENCOUNTER — Ambulatory Visit (HOSPITAL_COMMUNITY): Payer: 59 | Admitting: Clinical

## 2020-02-20 NOTE — Telephone Encounter (Signed)
The OPT therapist attempted text to session x2 @ 2:00PM and 2:10PM, however, the patient did not respond and missed their appointment.

## 2020-02-22 ENCOUNTER — Other Ambulatory Visit (INDEPENDENT_AMBULATORY_CARE_PROVIDER_SITE_OTHER): Payer: Self-pay | Admitting: Internal Medicine

## 2020-02-29 ENCOUNTER — Other Ambulatory Visit: Payer: Self-pay

## 2020-02-29 ENCOUNTER — Ambulatory Visit (INDEPENDENT_AMBULATORY_CARE_PROVIDER_SITE_OTHER): Payer: 59 | Admitting: Clinical

## 2020-02-29 ENCOUNTER — Telehealth (HOSPITAL_COMMUNITY): Payer: Self-pay | Admitting: Clinical

## 2020-02-29 DIAGNOSIS — F338 Other recurrent depressive disorders: Secondary | ICD-10-CM | POA: Diagnosis not present

## 2020-02-29 DIAGNOSIS — F4321 Adjustment disorder with depressed mood: Secondary | ICD-10-CM

## 2020-02-29 NOTE — Progress Notes (Signed)
Virtual Visit via Telephone Note  I connected with Kyle Atkins on 02/29/20 at  3:00 PM EST by telephone and verified that I am speaking with the correct person using two identifiers.  Location: Patient: Home Provider: Office    I discussed the limitations, risks, security and privacy concerns of performing an evaluation and management service by telephone and the availability of in person appointments. I also discussed with the patient that there may be a patient responsible charge related to this service. The patient expressed understanding and agreed to proceed.    THERAPIST PROGRESS NOTE  Session Time: 3:15PM-3:45PM  Participation Level: Active  Behavioral Response: CasualAlertDepressed  Type of Therapy: Individual Therapy  Treatment Goals addressed: Coping  Interventions: CBT  Summary: Kyle Atkins is a 44 y.o. male who presents with Depressed Mood and Grief. The OPT therapist met with the patient for his ongoing OPT treatment. The OPT therapist utilized Motivational Interviewing to work with the patient in establishing therapeutic repore. The patient in this session worked in Ambulance person with the [place] provider giving his feedback about the past week and identifying triggers to his mental health symptoms including the ongoing difficulty of functioning while still mourning the loss of his son. The OPT therapist worked with the patient providing Cognitive Behavioral Therapy while reviewing the clients thoughts, feelings, and behaviors including reviewing Grieving Stages and the importance of implementing coping.  The OPT therapist assessed lethality with the patient reporting no current S/I or H/I.   Suicidal/Homicidal: Nowithout intent/plan  Therapist Response: The OPT therapist met with the patient for his scheduled OPT session. The patient was responsive/collaborative/ and actively involved in his session. The patient gave feedback about current triggers including ongoing  grief for the loss of his child while indicating some improvement within his marriage with his wife. The OPT therapist provided CBT as treatment during the session as well as worked with the patient reviewing coping strategies. The patient agreed to turn his anger into a positive and get back to working out. The OPT therapist will continue treatment work with the patient in his next scheduled session.   Plan: Return again in 2 weeks.  Diagnosis: Axis I: Other recurrent depressive disorders and Greif   Axis II: No Diagnosis    Lennox Grumbles, LCSW 02/29/2020

## 2020-02-29 NOTE — Telephone Encounter (Signed)
The OPT therapist attempted txt to session x2 @ 3:00PM and 3:10PM with no response, the patient missed his scheduled session.

## 2020-05-06 ENCOUNTER — Encounter (INDEPENDENT_AMBULATORY_CARE_PROVIDER_SITE_OTHER): Payer: Self-pay | Admitting: Internal Medicine

## 2020-05-06 ENCOUNTER — Other Ambulatory Visit: Payer: Self-pay

## 2020-05-06 ENCOUNTER — Ambulatory Visit (INDEPENDENT_AMBULATORY_CARE_PROVIDER_SITE_OTHER): Payer: Managed Care, Other (non HMO) | Admitting: Internal Medicine

## 2020-05-06 VITALS — BP 105/75 | HR 94 | Temp 97.9°F | Ht 70.0 in | Wt 167.8 lb

## 2020-05-06 DIAGNOSIS — E559 Vitamin D deficiency, unspecified: Secondary | ICD-10-CM | POA: Diagnosis not present

## 2020-05-06 DIAGNOSIS — R6882 Decreased libido: Secondary | ICD-10-CM | POA: Diagnosis not present

## 2020-05-06 MED ORDER — TESTOSTERONE CYPIONATE 200 MG/ML IM SOLN: INTRAMUSCULAR | 2 refills | Status: DC

## 2020-05-06 NOTE — Progress Notes (Signed)
Metrics: Intervention Frequency ACO  Documented Smoking Status Yearly  Screened one or more times in 24 months  Cessation Counseling or  Active cessation medication Past 24 months  Past 24 months   Guideline developer: UpToDate (See UpToDate for funding source) Date Released: 2014       Wellness Office Visit  Subjective:  Patient ID: Kyle Atkins, male    DOB: 1976/09/28  Age: 44 y.o. MRN: [id number]  CC: This man comes in for follow-up of testosterone therapy and vitamin D deficiency. HPI  He is doing well.  Exercising is inconsistent but he is trying the best he can do. He continues on testosterone therapy and he requires a refill. He has been taking a higher dose of vitamin D3 10,000 units daily as his levels previously were suboptimal. Past Medical History:  Diagnosis Date  . Decreased libido 09/19/2019  . HLD (hyperlipidemia) 09/19/2019  . Vitamin D deficiency disease 09/19/2019      Family History  Problem Relation Age of Onset  . Heart disease Father 33  . Colon cancer Maternal Aunt        52s  . Colon polyps Mother     Social History   Social History Narrative   Married 15 years.Works at [employer].   Social History   Tobacco Use  . Smoking status: Former Research scientist (life sciences)  . Smokeless tobacco: Never Used  . Tobacco comment: only as teenager  Substance Use Topics  . Alcohol use: Yes    Comment: socially, once per month    Current Meds  Medication Sig  . Cholecalciferol (VITAMIN D-3) 125 MCG (5000 UT) TABS Take 2 tablets by mouth daily.   Marland Kitchen testosterone cypionate (DEPOTESTOSTERONE CYPIONATE) 200 MG/ML injection INJECT 0.5ML INTRAMUSCULARLY TWICE A WEEK.  . [DISCONTINUED] testosterone cypionate (DEPOTESTOSTERONE CYPIONATE) 200 MG/ML injection INJECT 0.5ML INTRAMUSCULARLY TWICE A WEEK.       Objective:   Today's Vitals: BP 105/75 (BP Location: Right Arm, Patient Position: Sitting, Cuff Size: Normal)   Pulse 94   Temp 97.9 F (36.6 C) (Temporal)   Ht 5'  10" (1.778 m)   Wt 167 lb 12.8 oz (76.1 kg)   SpO2 97%   BMI 24.08 kg/m  Vitals with BMI 05/06/2020 11/07/2019 09/19/2019  Height 5\' 10"  5\' 9"  5\' 9"   Weight 167 lbs 13 oz 171 lbs 3 oz 167 lbs 6 oz  BMI 24.08 [id number]  Systolic [id number]  Diastolic 75 78 80  Pulse 94 64 72     Physical Exam   He looks systemically well.  He has lost about 4 pounds since the last visit.  Blood pressure is excellent.   Assessment   1. Vitamin D deficiency disease   2. Decreased libido       Tests ordered Orders Placed This Encounter  Procedures  . COMPLETE METABOLIC PANEL WITH GFR  . VITAMIN D 25 Hydroxy (Vit-D Deficiency, Fractures)     Plan: 1. Blood work is ordered. 2. He will continue with vitamin D3 supplementation and testosterone therapy as before for his 2 conditions that we treat chronically. 3. I will send him a refill of testosterone today. 4. Follow-up in about 6 months time for an annual physical exam.   Meds ordered this encounter  Medications  . testosterone cypionate (DEPOTESTOSTERONE CYPIONATE) 200 MG/ML injection    Sig: INJECT 0.5ML INTRAMUSCULARLY TWICE A WEEK.    Dispense:  10 mL    Refill:  2    Terrionna Bridwell C Delphina Schum,  MD

## 2020-05-07 LAB — COMPLETE METABOLIC PANEL WITH GFR
AG Ratio: 1.9 (calc) (ref 1.0–2.5)
ALT: 23 U/L (ref 9–46)
AST: 20 U/L (ref 10–40)
Albumin: 4.1 g/dL (ref 3.6–5.1)
Alkaline phosphatase (APISO): 56 U/L (ref 36–130)
BUN/Creatinine Ratio: 25 (calc) — ABNORMAL HIGH (ref 6–22)
BUN: 26 mg/dL — ABNORMAL HIGH (ref 7–25)
CO2: 27 mmol/L (ref 20–32)
Calcium: 9.7 mg/dL (ref 8.6–10.3)
Chloride: 102 mmol/L (ref 98–110)
Creat: 1.04 mg/dL (ref 0.60–1.35)
GFR, Est African American: 102 mL/min/{1.73_m2} (ref >=60)
GFR, Est Non African American: 88 mL/min/{1.73_m2} (ref >=60)
Globulin: 2.2 g/dL (calc) (ref 1.9–3.7)
Glucose, Bld: 72 mg/dL (ref 65–99)
Potassium: 4.4 mmol/L (ref 3.5–5.3)
Sodium: 137 mmol/L (ref 135–146)
Total Bilirubin: 1.4 mg/dL — ABNORMAL HIGH (ref 0.2–1.2)
Total Protein: 6.3 g/dL (ref 6.1–8.1)

## 2020-05-07 LAB — VITAMIN D 25 HYDROXY (VIT D DEFICIENCY, FRACTURES): Vit D, 25-Hydroxy: 49 ng/mL (ref 30–100)

## 2020-11-07 ENCOUNTER — Ambulatory Visit (INDEPENDENT_AMBULATORY_CARE_PROVIDER_SITE_OTHER): Payer: Managed Care, Other (non HMO) | Admitting: Internal Medicine

## 2020-11-07 ENCOUNTER — Other Ambulatory Visit: Payer: Self-pay

## 2020-11-07 ENCOUNTER — Encounter (INDEPENDENT_AMBULATORY_CARE_PROVIDER_SITE_OTHER): Payer: Self-pay | Admitting: Internal Medicine

## 2020-11-07 VITALS — BP 122/78 | HR 84 | Temp 97.7°F | Ht 70.0 in | Wt 162.8 lb

## 2020-11-07 DIAGNOSIS — E782 Mixed hyperlipidemia: Secondary | ICD-10-CM | POA: Diagnosis not present

## 2020-11-07 DIAGNOSIS — Z1159 Encounter for screening for other viral diseases: Secondary | ICD-10-CM | POA: Diagnosis not present

## 2020-11-07 DIAGNOSIS — Z125 Encounter for screening for malignant neoplasm of prostate: Secondary | ICD-10-CM

## 2020-11-07 DIAGNOSIS — Z0001 Encounter for general adult medical examination with abnormal findings: Secondary | ICD-10-CM

## 2020-11-07 DIAGNOSIS — R5383 Other fatigue: Secondary | ICD-10-CM

## 2020-11-07 DIAGNOSIS — M79671 Pain in right foot: Secondary | ICD-10-CM

## 2020-11-07 DIAGNOSIS — R5381 Other malaise: Secondary | ICD-10-CM

## 2020-11-07 NOTE — Progress Notes (Signed)
Chief Complaint: This 44 year old man comes in for an annual physical exam and to address any complaints that he has. HPI: Overall he is doing well.  He does have mild hyperlipidemia which has not required statin therapy. He continues on testosterone therapy for decreased testosterone levels and symptomatic libido problems. He has done well with this. He continues on vitamin D3 supplementation for vitamin D deficiency. He is complaining of right heel pain which has been present for the last 2 to 3 months.  It tends to be worse when he walks.  He denies any specific trauma.  Past Medical History:  Diagnosis Date  . Decreased libido 09/19/2019  . HLD (hyperlipidemia) 09/19/2019  . Vitamin D deficiency disease 09/19/2019   Past Surgical History:  Procedure Laterality Date  . BACK SURGERY     lower, X2  . COLONOSCOPY  02/15/2012   Dr. Gala Romney, internal hemorrhoids, hyperplastic polyp.   . COLONOSCOPY N/A 09/10/2017   Procedure: COLONOSCOPY;  Surgeon: Daneil Dolin, MD;  [facility];  Service: Endoscopy;  Laterality: N/A;  2:00pm  . HAND SURGERY     right     Social History   Social History Narrative   Married 16 years.Works at [employer].Lives with wife and son.    Social History   Tobacco Use  . Smoking status: Former Research scientist (life sciences)  . Smokeless tobacco: Never Used  . Tobacco comment: only as teenager  Substance Use Topics  . Alcohol use: Yes    Comment: socially, once per month      Allergies: No Known Allergies   Current Meds  Medication Sig  . Cholecalciferol (VITAMIN D-3) 125 MCG (5000 UT) TABS Take 2 tablets by mouth daily.   Marland Kitchen testosterone cypionate (DEPOTESTOSTERONE CYPIONATE) 200 MG/ML injection INJECT 0.5ML INTRAMUSCULARLY TWICE A WEEK.       Depression screen [facility] 2/9 11/07/2020  Decreased Interest 0  Down, Depressed, Hopeless 0  PHQ - 2 Score 0  Altered sleeping 0  Tired, decreased energy 0  Change in appetite 0  Feeling bad or failure  about yourself  0  Trouble concentrating 0  Moving slowly or fidgety/restless 0  Suicidal thoughts 0  PHQ-9 Score 0  Difficult doing work/chores Not difficult at all     XBW:IOMBT from the symptoms mentioned above,there are no other symptoms referable to all systems reviewed.       Physical Exam: Blood pressure 122/78, pulse 84, temperature 97.7 F (36.5 C), temperature source Temporal, height 5\' 10"  (1.778 m), weight 162 lb 12.8 oz (73.8 kg), SpO2 96 %. Vitals with BMI 11/07/2020 05/06/2020 11/07/2019  Height 5\' 10"  5\' 10"  5\' 9"   Weight 162 lbs 13 oz 167 lbs 13 oz 171 lbs 3 oz  BMI 23.36 59.74 16.38  Systolic 453 646 803  Diastolic 78 75 78  Pulse 84 94 64      He looks systemically well.  Healthy weight. General: Alert, cooperative, and appears to be the stated age.No pallor.  No jaundice.  No clubbing. Head: Normocephalic Eyes: Sclera white, pupils equal and reactive to light, red reflex x 2,  Ears: Normal bilaterally Oral cavity: Lips, mucosa, and tongue normal: Teeth and gums normal Neck: No adenopathy, supple, symmetrical, trachea midline, and thyroid does not appear enlarged Respiratory: Clear to auscultation bilaterally.No wheezing, crackles or bronchial breathing. Cardiovascular: Heart sounds are present and appear to be normal without murmurs or added sounds.  No carotid bruits.  Peripheral pulses are present and equal bilaterally.: Gastrointestinal:positive  bowel sounds, no hepatosplenomegaly.  No masses felt.No tenderness. Skin: Clear, No rashes noted.No worrisome skin lesions seen. Neurological: Grossly intact without focal findings, cranial nerves II through XII intact, muscle strength equal bilaterally Musculoskeletal: No acute joint abnormalities noted.Full range of movement noted with joints.  There is some tenderness in the right calcaneal bone. Psychiatric: Affect appropriate, non-anxious.    Assessment  1. Encounter for general adult medical  examination with abnormal findings   2. Mixed hyperlipidemia   3. Malaise and fatigue   4. Special screening for malignant neoplasm of prostate   5. Encounter for hepatitis C screening test for low risk patient   6. Pain of right heel     Tests Ordered:   Orders Placed This Encounter  Procedures  . Lipid panel  . COMPLETE METABOLIC PANEL WITH GFR  . PSA, Total with Reflex to PSA, Free  . Hepatitis C antibody  . Ambulatory referral to [place]  1. Healthy 44 year old man with mild hyperlipidemia on testosterone therapy. 2. Blood work is ordered. 3. I will refer him to podiatry for his right heel pain. 4. I discussed COVID-19 vaccination again and he absolutely does not want to take this vaccine. 5. Follow-up in 6 months. 6. Today in addition to a preventative visit, I performed an office visit also to address his chronic conditions and his heel pain.     No orders of the defined types were placed in this encounter.    Daniyla Pfahler C Starr Engel   11/07/2020, 9:33 AM

## 2020-11-08 LAB — COMPLETE METABOLIC PANEL WITH GFR
AG Ratio: 1.4 (calc) (ref 1.0–2.5)
ALT: 26 U/L (ref 9–46)
AST: 21 U/L (ref 10–40)
Albumin: 4.2 g/dL (ref 3.6–5.1)
Alkaline phosphatase (APISO): 62 U/L (ref 36–130)
BUN: 10 mg/dL (ref 7–25)
CO2: 26 mmol/L (ref 20–32)
Calcium: 9.5 mg/dL (ref 8.6–10.3)
Chloride: 102 mmol/L (ref 98–110)
Creat: 1.26 mg/dL (ref 0.60–1.35)
GFR, Est African American: 80 mL/min/{1.73_m2} (ref >=60)
GFR, Est Non African American: 69 mL/min/{1.73_m2} (ref >=60)
Globulin: 2.9 g/dL (calc) (ref 1.9–3.7)
Glucose, Bld: 91 mg/dL (ref 65–99)
Potassium: 4.2 mmol/L (ref 3.5–5.3)
Sodium: 138 mmol/L (ref 135–146)
Total Bilirubin: 1.8 mg/dL — ABNORMAL HIGH (ref 0.2–1.2)
Total Protein: 7.1 g/dL (ref 6.1–8.1)

## 2020-11-08 LAB — HEPATITIS C ANTIBODY
Hepatitis C Ab: NONREACTIVE
SIGNAL TO CUT-OFF: 0.02 (ref <1.00)

## 2020-11-08 LAB — LIPID PANEL
Cholesterol: 197 mg/dL (ref <200)
HDL: 60 mg/dL (ref >=40)
LDL Cholesterol (Calc): 120 mg/dL (calc) — ABNORMAL HIGH
Non-HDL Cholesterol (Calc): 137 mg/dL (calc) — ABNORMAL HIGH (ref <130)
Total CHOL/HDL Ratio: 3.3 (calc) (ref <5.0)
Triglycerides: 76 mg/dL (ref <150)

## 2020-11-08 LAB — PSA, TOTAL WITH REFLEX TO PSA, FREE: PSA, Total: 0.6 ng/mL (ref <=4.0)

## 2020-11-11 ENCOUNTER — Encounter (INDEPENDENT_AMBULATORY_CARE_PROVIDER_SITE_OTHER): Payer: Self-pay | Admitting: Internal Medicine

## 2020-11-12 ENCOUNTER — Ambulatory Visit (INDEPENDENT_AMBULATORY_CARE_PROVIDER_SITE_OTHER): Payer: Managed Care, Other (non HMO)

## 2020-11-12 ENCOUNTER — Ambulatory Visit: Payer: Managed Care, Other (non HMO) | Admitting: Podiatry

## 2020-11-12 ENCOUNTER — Other Ambulatory Visit: Payer: Self-pay

## 2020-11-12 DIAGNOSIS — M722 Plantar fascial fibromatosis: Secondary | ICD-10-CM

## 2020-11-12 DIAGNOSIS — M79671 Pain in right foot: Secondary | ICD-10-CM

## 2020-11-12 MED ORDER — MELOXICAM 15 MG PO TABS
15.0000 mg | ORAL_TABLET | Freq: Every day | ORAL | 3 refills | Status: DC
Start: 2020-11-12 — End: 2021-05-08

## 2020-11-12 NOTE — Patient Instructions (Signed)

## 2020-11-13 ENCOUNTER — Encounter: Payer: Self-pay | Admitting: Podiatry

## 2020-11-13 DIAGNOSIS — M722 Plantar fascial fibromatosis: Secondary | ICD-10-CM | POA: Diagnosis not present

## 2020-11-13 MED ORDER — TRIAMCINOLONE ACETONIDE 40 MG/ML IJ SUSP
10.0000 mg | Freq: Once | INTRAMUSCULAR | Status: AC
  Administered 2020-11-13: 10 mg

## 2020-11-13 MED ORDER — DEXAMETHASONE SODIUM PHOSPHATE 4 MG/ML IJ SOLN
4.0000 mg | Freq: Once | INTRAMUSCULAR | Status: AC
  Administered 2020-11-13: 4 mg

## 2020-11-13 NOTE — Progress Notes (Signed)
  Subjective:  Patient ID: Kyle Atkins, male    DOB: 10-24-1976,  MRN: [id number]  Chief Complaint  Patient presents with  . Foot Pain    RIght heel pain for about 3 months. Pt stated that it is worse in the morning but the pain is constant.    44 y.o. male presents with the above complaint. History confirmed with patient.   Objective:  Physical Exam: warm, good capillary refill, no trophic changes or ulcerative lesions, normal DP and PT pulses and normal sensory exam.   Right Foot: Pain on palpation of the plantar heel at the insertion of the plantar fascia on the calcaneus.  There is good range of motion at the ankle joint.  No pain or masses in the plantar fascia  Radiographs: X-ray of the right foot: Tiny plantar calcaneal enthesophyte Assessment:   1. Foot pain, right   2. Plantar fasciitis of right foot      Plan:  Patient was evaluated and treated and all questions answered.  Discussed the etiology and treatment options for plantar fasciitis including stretching, formal physical therapy, supportive shoegears such as a running shoe or sneaker, pre fabricated orthoses, injection therapy, and oral medications. We also discussed the role of surgical treatment of this for patients who do not improve after exhausting non-surgical treatment options.    -XR reviewed with patient -Educated patient on stretching and icing of the affected limb -Plantar fascial brace dispensed -Injection delivered to the plantar fascia of the right foot. -Rx for meloxicam. Educated on use, risks and benefits of the medication  After sterile prep with povidone-iodine solution and alcohol, the right heel was injected with 1 cc 2% Xylocaine, 10 mg of triamcinolone, 4 mg dexamethasone was injected along the plantar fascia at the insertion on the plantar calcaneus. The patient tolerated the procedure well without complication.   Return in about 1 month (around 12/12/2020).

## 2020-11-20 ENCOUNTER — Other Ambulatory Visit: Payer: Self-pay | Admitting: Podiatry

## 2020-11-20 DIAGNOSIS — M722 Plantar fascial fibromatosis: Secondary | ICD-10-CM

## 2020-12-12 ENCOUNTER — Ambulatory Visit: Payer: Managed Care, Other (non HMO) | Admitting: Podiatry

## 2020-12-12 ENCOUNTER — Other Ambulatory Visit: Payer: Self-pay

## 2020-12-12 DIAGNOSIS — M722 Plantar fascial fibromatosis: Secondary | ICD-10-CM | POA: Diagnosis not present

## 2020-12-16 NOTE — Progress Notes (Signed)
  Subjective:  Patient ID: Kyle Atkins, male    DOB: 11-04-1976,  MRN: [id number]  Chief Complaint  Patient presents with  . Foot Pain    Pt stated that he is still having some pain but it is not as bad , he also stated that he has no concerns at this time.    44 y.o. male returns with the above complaint. History confirmed with patient.   Objective:  Physical Exam: warm, good capillary refill, no trophic changes or ulcerative lesions, normal DP and PT pulses and normal sensory exam.   Right Foot: Pain on palpation of the plantar heel at the insertion of the plantar fascia on the calcaneus, slightly improved since last visit.  There is good range of motion at the ankle joint.  No pain or masses in the plantar fascia  Radiographs: X-ray of the right foot: Tiny plantar calcaneal enthesophyte Assessment:   No diagnosis found.   Plan:  Patient was evaluated and treated and all questions answered.    -Continue stretching and icing of the affected limb -Continue plantar fascial brace.  We discussed further immobilization such as a CAM boot but I think this will be a negative impact on his job and he would like to avoid this for now. -Repeat injection delivered to the plantar fascia of the right foot. -Continue meloxicam. Educated on use, risks and benefits of the medication  After sterile prep with povidone-iodine solution and alcohol, the right heel was injected with 1 cc 2% Xylocaine, 10 mg of triamcinolone, 4 mg dexamethasone was injected along the plantar fascia at the insertion on the plantar calcaneus. The patient tolerated the procedure well without complication.   No follow-ups on file.

## 2020-12-30 DIAGNOSIS — M25571 Pain in right ankle and joints of right foot: Secondary | ICD-10-CM | POA: Diagnosis not present

## 2020-12-30 DIAGNOSIS — R2689 Other abnormalities of gait and mobility: Secondary | ICD-10-CM | POA: Diagnosis not present

## 2020-12-30 DIAGNOSIS — M722 Plantar fascial fibromatosis: Secondary | ICD-10-CM | POA: Diagnosis not present

## 2020-12-30 DIAGNOSIS — R531 Weakness: Secondary | ICD-10-CM | POA: Diagnosis not present

## 2021-01-01 DIAGNOSIS — M722 Plantar fascial fibromatosis: Secondary | ICD-10-CM | POA: Diagnosis not present

## 2021-01-01 DIAGNOSIS — R2689 Other abnormalities of gait and mobility: Secondary | ICD-10-CM | POA: Diagnosis not present

## 2021-01-01 DIAGNOSIS — R531 Weakness: Secondary | ICD-10-CM | POA: Diagnosis not present

## 2021-01-01 DIAGNOSIS — M25571 Pain in right ankle and joints of right foot: Secondary | ICD-10-CM | POA: Diagnosis not present

## 2021-01-03 DIAGNOSIS — R2689 Other abnormalities of gait and mobility: Secondary | ICD-10-CM | POA: Diagnosis not present

## 2021-01-03 DIAGNOSIS — M25571 Pain in right ankle and joints of right foot: Secondary | ICD-10-CM | POA: Diagnosis not present

## 2021-01-03 DIAGNOSIS — R531 Weakness: Secondary | ICD-10-CM | POA: Diagnosis not present

## 2021-01-03 DIAGNOSIS — M722 Plantar fascial fibromatosis: Secondary | ICD-10-CM | POA: Diagnosis not present

## 2021-01-08 DIAGNOSIS — R2689 Other abnormalities of gait and mobility: Secondary | ICD-10-CM | POA: Diagnosis not present

## 2021-01-08 DIAGNOSIS — R531 Weakness: Secondary | ICD-10-CM | POA: Diagnosis not present

## 2021-01-08 DIAGNOSIS — M722 Plantar fascial fibromatosis: Secondary | ICD-10-CM | POA: Diagnosis not present

## 2021-01-08 DIAGNOSIS — M25571 Pain in right ankle and joints of right foot: Secondary | ICD-10-CM | POA: Diagnosis not present

## 2021-01-09 DIAGNOSIS — R2689 Other abnormalities of gait and mobility: Secondary | ICD-10-CM | POA: Diagnosis not present

## 2021-01-09 DIAGNOSIS — M722 Plantar fascial fibromatosis: Secondary | ICD-10-CM | POA: Diagnosis not present

## 2021-01-09 DIAGNOSIS — M25571 Pain in right ankle and joints of right foot: Secondary | ICD-10-CM | POA: Diagnosis not present

## 2021-01-09 DIAGNOSIS — R531 Weakness: Secondary | ICD-10-CM | POA: Diagnosis not present

## 2021-01-13 DIAGNOSIS — R531 Weakness: Secondary | ICD-10-CM | POA: Diagnosis not present

## 2021-01-13 DIAGNOSIS — M722 Plantar fascial fibromatosis: Secondary | ICD-10-CM | POA: Diagnosis not present

## 2021-01-13 DIAGNOSIS — M25571 Pain in right ankle and joints of right foot: Secondary | ICD-10-CM | POA: Diagnosis not present

## 2021-01-13 DIAGNOSIS — R2689 Other abnormalities of gait and mobility: Secondary | ICD-10-CM | POA: Diagnosis not present

## 2021-01-16 DIAGNOSIS — M25571 Pain in right ankle and joints of right foot: Secondary | ICD-10-CM | POA: Diagnosis not present

## 2021-01-16 DIAGNOSIS — R531 Weakness: Secondary | ICD-10-CM | POA: Diagnosis not present

## 2021-01-16 DIAGNOSIS — M722 Plantar fascial fibromatosis: Secondary | ICD-10-CM | POA: Diagnosis not present

## 2021-01-16 DIAGNOSIS — R2689 Other abnormalities of gait and mobility: Secondary | ICD-10-CM | POA: Diagnosis not present

## 2021-01-17 DIAGNOSIS — M722 Plantar fascial fibromatosis: Secondary | ICD-10-CM | POA: Diagnosis not present

## 2021-01-17 DIAGNOSIS — M25571 Pain in right ankle and joints of right foot: Secondary | ICD-10-CM | POA: Diagnosis not present

## 2021-01-17 DIAGNOSIS — R531 Weakness: Secondary | ICD-10-CM | POA: Diagnosis not present

## 2021-01-17 DIAGNOSIS — R2689 Other abnormalities of gait and mobility: Secondary | ICD-10-CM | POA: Diagnosis not present

## 2021-01-20 DIAGNOSIS — R531 Weakness: Secondary | ICD-10-CM | POA: Diagnosis not present

## 2021-01-20 DIAGNOSIS — M25571 Pain in right ankle and joints of right foot: Secondary | ICD-10-CM | POA: Diagnosis not present

## 2021-01-20 DIAGNOSIS — R2689 Other abnormalities of gait and mobility: Secondary | ICD-10-CM | POA: Diagnosis not present

## 2021-01-20 DIAGNOSIS — M722 Plantar fascial fibromatosis: Secondary | ICD-10-CM | POA: Diagnosis not present

## 2021-01-23 ENCOUNTER — Ambulatory Visit (INDEPENDENT_AMBULATORY_CARE_PROVIDER_SITE_OTHER): Payer: Managed Care, Other (non HMO) | Admitting: Podiatry

## 2021-01-23 ENCOUNTER — Other Ambulatory Visit: Payer: Self-pay

## 2021-01-23 DIAGNOSIS — M722 Plantar fascial fibromatosis: Secondary | ICD-10-CM

## 2021-01-23 NOTE — Patient Instructions (Signed)
Continue therapy for at least 4 weeks, if longer, that is fine, decide with your therapist. Call if issues or pain is severely worse

## 2021-01-24 DIAGNOSIS — M25571 Pain in right ankle and joints of right foot: Secondary | ICD-10-CM | POA: Diagnosis not present

## 2021-01-24 DIAGNOSIS — R531 Weakness: Secondary | ICD-10-CM | POA: Diagnosis not present

## 2021-01-24 DIAGNOSIS — M722 Plantar fascial fibromatosis: Secondary | ICD-10-CM | POA: Diagnosis not present

## 2021-01-24 DIAGNOSIS — R2689 Other abnormalities of gait and mobility: Secondary | ICD-10-CM | POA: Diagnosis not present

## 2021-01-26 ENCOUNTER — Encounter: Payer: Self-pay | Admitting: Podiatry

## 2021-01-26 NOTE — Progress Notes (Signed)
  Subjective:  Patient ID: Kyle Atkins, male    DOB: 10-Jun-1976,  MRN: [id number]  Chief Complaint  Patient presents with  . Plantar Fasciitis    PT stated that his foot is still a little tender but overall he is doing a lot better and stated that physical therapy has helped     45 y.o. male returns with the above complaint. History confirmed with patient.  Thinks he is about 60 to 70% better at this point  Objective:  Physical Exam: warm, good capillary refill, no trophic changes or ulcerative lesions, normal DP and PT pulses and normal sensory exam.   Right Foot: Today he has minimal pain on palpation of the plantar heel at the insertion of the plantar fascia on the calcaneus, again this is improved since last visit.  There is good range of motion at the ankle joint.  No pain or masses in the plantar fascia  Radiographs: X-ray of the right foot: Tiny plantar calcaneal enthesophyte Assessment:   1. Plantar fasciitis of right foot      Plan:  Patient was evaluated and treated and all questions answered.    -Continue stretching and icing of the affected limb -Continue plantar fascial brace.   -No injection delivered today, with necessary -Continue meloxicam. Educated on use, risks and benefits of the medication -Continue physical therapy    Return in about 8 weeks (around 03/20/2021) for recheck plantar fasciitis.

## 2021-01-30 DIAGNOSIS — R531 Weakness: Secondary | ICD-10-CM | POA: Diagnosis not present

## 2021-01-30 DIAGNOSIS — M722 Plantar fascial fibromatosis: Secondary | ICD-10-CM | POA: Diagnosis not present

## 2021-01-30 DIAGNOSIS — M25571 Pain in right ankle and joints of right foot: Secondary | ICD-10-CM | POA: Diagnosis not present

## 2021-01-30 DIAGNOSIS — R2689 Other abnormalities of gait and mobility: Secondary | ICD-10-CM | POA: Diagnosis not present

## 2021-02-06 DIAGNOSIS — M722 Plantar fascial fibromatosis: Secondary | ICD-10-CM | POA: Diagnosis not present

## 2021-02-06 DIAGNOSIS — M25571 Pain in right ankle and joints of right foot: Secondary | ICD-10-CM | POA: Diagnosis not present

## 2021-02-06 DIAGNOSIS — R2689 Other abnormalities of gait and mobility: Secondary | ICD-10-CM | POA: Diagnosis not present

## 2021-02-06 DIAGNOSIS — R531 Weakness: Secondary | ICD-10-CM | POA: Diagnosis not present

## 2021-02-13 DIAGNOSIS — M25571 Pain in right ankle and joints of right foot: Secondary | ICD-10-CM | POA: Diagnosis not present

## 2021-02-13 DIAGNOSIS — R531 Weakness: Secondary | ICD-10-CM | POA: Diagnosis not present

## 2021-02-13 DIAGNOSIS — M722 Plantar fascial fibromatosis: Secondary | ICD-10-CM | POA: Diagnosis not present

## 2021-02-13 DIAGNOSIS — R2689 Other abnormalities of gait and mobility: Secondary | ICD-10-CM | POA: Diagnosis not present

## 2021-02-25 DIAGNOSIS — R2689 Other abnormalities of gait and mobility: Secondary | ICD-10-CM | POA: Diagnosis not present

## 2021-02-25 DIAGNOSIS — M25571 Pain in right ankle and joints of right foot: Secondary | ICD-10-CM | POA: Diagnosis not present

## 2021-02-25 DIAGNOSIS — M722 Plantar fascial fibromatosis: Secondary | ICD-10-CM | POA: Diagnosis not present

## 2021-02-25 DIAGNOSIS — R531 Weakness: Secondary | ICD-10-CM | POA: Diagnosis not present

## 2021-02-27 DIAGNOSIS — R531 Weakness: Secondary | ICD-10-CM | POA: Diagnosis not present

## 2021-02-27 DIAGNOSIS — M722 Plantar fascial fibromatosis: Secondary | ICD-10-CM | POA: Diagnosis not present

## 2021-02-27 DIAGNOSIS — M25571 Pain in right ankle and joints of right foot: Secondary | ICD-10-CM | POA: Diagnosis not present

## 2021-02-27 DIAGNOSIS — R2689 Other abnormalities of gait and mobility: Secondary | ICD-10-CM | POA: Diagnosis not present

## 2021-03-07 DIAGNOSIS — M722 Plantar fascial fibromatosis: Secondary | ICD-10-CM | POA: Diagnosis not present

## 2021-03-07 DIAGNOSIS — R531 Weakness: Secondary | ICD-10-CM | POA: Diagnosis not present

## 2021-03-07 DIAGNOSIS — R2689 Other abnormalities of gait and mobility: Secondary | ICD-10-CM | POA: Diagnosis not present

## 2021-03-07 DIAGNOSIS — M25571 Pain in right ankle and joints of right foot: Secondary | ICD-10-CM | POA: Diagnosis not present

## 2021-03-10 ENCOUNTER — Other Ambulatory Visit (INDEPENDENT_AMBULATORY_CARE_PROVIDER_SITE_OTHER): Payer: Self-pay | Admitting: Internal Medicine

## 2021-03-20 ENCOUNTER — Encounter: Payer: Self-pay | Admitting: Podiatry

## 2021-03-20 ENCOUNTER — Other Ambulatory Visit: Payer: Self-pay

## 2021-03-20 ENCOUNTER — Ambulatory Visit (INDEPENDENT_AMBULATORY_CARE_PROVIDER_SITE_OTHER): Payer: BC Managed Care – PPO | Admitting: Podiatry

## 2021-03-20 DIAGNOSIS — M722 Plantar fascial fibromatosis: Secondary | ICD-10-CM

## 2021-03-20 NOTE — Progress Notes (Signed)
  Subjective:  Patient ID: Kyle Atkins, male    DOB: 20-Sep-1976,  MRN: [id number]  Chief Complaint  Patient presents with  . Plantar Fasciitis    PT stated that he is doing okay he does have some slight pain     45 y.o. male returns with the above complaint. History confirmed with patient.  Still continues to nag at a 2 out of 10 to 4 out of 10 pain level Objective:  Physical Exam: warm, good capillary refill, no trophic changes or ulcerative lesions, normal DP and PT pulses and normal sensory exam.   Right Foot: Still has pain at the plantar heel at the insertion of the plantar fascia  Radiographs: X-ray of the right foot: Tiny plantar calcaneal enthesophyte Assessment:   1. Plantar fasciitis of right foot      Plan:  Patient was evaluated and treated and all questions answered.  This point he continues to have persistent plantar fasciitis.  I recommended 1 more injection today.  If this is not successful we will consider MRI and surgical correction  -After sterile prep with povidone-iodine solution and alcohol, the right heel was injected with 0.5cc 2% xylocaine plain, 0.5cc 0.5% marcaine plain, 5mg  triamcinolone acetonide, and 2mg  dexamethasone was injected along  the plantar fascia at the insertion on the plantar calcaneus. The patient tolerated the procedure well without complication.     Return in about 4 weeks (around 04/17/2021) for recheck plantar fasciitis, order MRI if not better .

## 2021-03-21 DIAGNOSIS — M722 Plantar fascial fibromatosis: Secondary | ICD-10-CM | POA: Diagnosis not present

## 2021-03-21 DIAGNOSIS — M25571 Pain in right ankle and joints of right foot: Secondary | ICD-10-CM | POA: Diagnosis not present

## 2021-03-21 DIAGNOSIS — R2689 Other abnormalities of gait and mobility: Secondary | ICD-10-CM | POA: Diagnosis not present

## 2021-03-21 DIAGNOSIS — R531 Weakness: Secondary | ICD-10-CM | POA: Diagnosis not present

## 2021-04-17 ENCOUNTER — Ambulatory Visit: Payer: BC Managed Care – PPO | Admitting: Podiatry

## 2021-04-17 ENCOUNTER — Other Ambulatory Visit: Payer: Self-pay

## 2021-04-17 DIAGNOSIS — M79671 Pain in right foot: Secondary | ICD-10-CM

## 2021-04-17 DIAGNOSIS — M722 Plantar fascial fibromatosis: Secondary | ICD-10-CM | POA: Diagnosis not present

## 2021-04-19 ENCOUNTER — Encounter: Payer: Self-pay | Admitting: Podiatry

## 2021-04-19 NOTE — Progress Notes (Signed)
  Subjective:  Patient ID: Kyle Atkins, male    DOB: 02-14-1976,  MRN: [id number]  Chief Complaint  Patient presents with  . Plantar Fasciitis    PT stated that he is still having some pain he stated that the injection did not help and he feels like it made it worse     45 y.o. male returns with the above complaint. History confirmed with patient.  Continues to worsen and then in the last injection made worse Objective:  Physical Exam: warm, good capillary refill, no trophic changes or ulcerative lesions, normal DP and PT pulses and normal sensory exam.   Right Foot: Still has pain at the plantar heel at the insertion of the plantar fascia  Radiographs: X-ray of the right foot: Tiny plantar calcaneal enthesophyte Assessment:   1. Plantar fasciitis of right foot      Plan:  Patient was evaluated and treated and all questions answered.   At this point I think she consider surgical intervention.  I discussed this with him including the risk, benefits and potential complications.  MRI was ordered.  He will be scheduled for endoscopic plantar fasciotomy versus fasciotomy with heel spur resection.  All questions were addressed.  Informed consent was signed and reviewed.  CAM boot was dispensed.   Surgical plan:  Procedure: -EPF versus heel spur resection and fasciotomy  Location: -[facility]  Anesthesia plan: -IV sedation with local anesthesia  Postoperative pain plan: - Tylenol 1000 mg every 6 hours, ibuprofen 600 mg every 6 hours, gabapentin 300 mg every 8 hours x5 days, oxycodone 5 mg 1-2 tabs every 6 hours only as needed  DVT prophylaxis: -None  WB Restrictions / DME needs: -WBAT in CAM boot this is dispensed today      No follow-ups on file.

## 2021-04-24 ENCOUNTER — Telehealth: Payer: Self-pay

## 2021-04-24 NOTE — Telephone Encounter (Signed)
Kyle Atkins called and wanted to know when he was going to be scheduled for an MRI? He stated he was supposed to have one prior to surgery. Please advise

## 2021-04-24 NOTE — Telephone Encounter (Signed)
I've ordered, should contact him soon to schedule

## 2021-04-24 NOTE — Addendum Note (Signed)
Addended bySherryle Lis, Fotios Amos R on: 04/24/2021 05:45 PM   Modules accepted: Orders

## 2021-05-02 ENCOUNTER — Telehealth: Payer: Self-pay | Admitting: Urology

## 2021-05-02 NOTE — Telephone Encounter (Signed)
DOS - 05/23/21  EPF RIGHT --- 16010   BCBS EFFECTIVE DATE - 12/21/20   PLAN DEDUCTIBLE - $1,400.00 W/ $1,109.00 to go OUT OF POCKET - $4,600.00 W/ $3,509.00 to go COINSURANCE - 0% COPAY - $0.00    SPOKE WITH WILL WITH [employer] AND HE STATED THAT FOR CPT CODE 93235 NO PRIOR AUTH IS REQUIRED.  REF #I - [id number]

## 2021-05-05 ENCOUNTER — Ambulatory Visit
Admission: RE | Admit: 2021-05-05 | Discharge: 2021-05-05 | Disposition: A | Discharge status: Home / Self Care | Payer: BC Managed Care – PPO | Source: Ambulatory Visit | Attending: Podiatry | Admitting: Podiatry

## 2021-05-05 ENCOUNTER — Other Ambulatory Visit: Payer: Self-pay

## 2021-05-05 DIAGNOSIS — R600 Localized edema: Secondary | ICD-10-CM | POA: Diagnosis not present

## 2021-05-05 DIAGNOSIS — M722 Plantar fascial fibromatosis: Secondary | ICD-10-CM

## 2021-05-05 DIAGNOSIS — M79671 Pain in right foot: Secondary | ICD-10-CM

## 2021-05-05 DIAGNOSIS — M25471 Effusion, right ankle: Secondary | ICD-10-CM | POA: Diagnosis not present

## 2021-05-05 DIAGNOSIS — G8929 Other chronic pain: Secondary | ICD-10-CM | POA: Diagnosis not present

## 2021-05-08 ENCOUNTER — Other Ambulatory Visit: Payer: Self-pay

## 2021-05-08 ENCOUNTER — Ambulatory Visit (INDEPENDENT_AMBULATORY_CARE_PROVIDER_SITE_OTHER): Payer: BC Managed Care – PPO | Admitting: Internal Medicine

## 2021-05-08 ENCOUNTER — Encounter (INDEPENDENT_AMBULATORY_CARE_PROVIDER_SITE_OTHER): Payer: Self-pay | Admitting: Internal Medicine

## 2021-05-08 VITALS — BP 130/80 | HR 72 | Ht 70.0 in | Wt 167.2 lb

## 2021-05-08 DIAGNOSIS — R6882 Decreased libido: Secondary | ICD-10-CM | POA: Diagnosis not present

## 2021-05-08 DIAGNOSIS — E559 Vitamin D deficiency, unspecified: Secondary | ICD-10-CM | POA: Diagnosis not present

## 2021-05-08 NOTE — Progress Notes (Signed)
Metrics: Intervention Frequency ACO  Documented Smoking Status Yearly  Screened one or more times in 24 months  Cessation Counseling or  Active cessation medication Past 24 months  Past 24 months   Guideline developer: UpToDate (See UpToDate for funding source) Date Released: 2014       Wellness Office Visit  Subjective:  Patient ID: Kyle Atkins, male    DOB: 1976-10-27  Age: 45 y.o. MRN: [id number]  CC: This pleasant man comes in for follow-up of testosterone therapy, vitamin D deficiency. HPI  Apart from suffering with plantar fasciitis of his foot, he is doing well.  He continues on testosterone therapy twice a week.  He is working out and doing Editor, commissioning 4 times a week.  He is trying to eat healthy as he can. Continue with vitamin D3 supplementation 10,000 units daily. Past Medical History:  Diagnosis Date  . Decreased libido 09/19/2019  . HLD (hyperlipidemia) 09/19/2019  . Vitamin D deficiency disease 09/19/2019   Past Surgical History:  Procedure Laterality Date  . BACK SURGERY     lower, X2  . COLONOSCOPY  02/15/2012   Dr. Gala Romney, internal hemorrhoids, hyperplastic polyp.   . COLONOSCOPY N/A 09/10/2017   Procedure: COLONOSCOPY;  Surgeon: Daneil Dolin, MD;  [facility];  Service: Endoscopy;  Laterality: N/A;  2:00pm  . HAND SURGERY     right     Family History  Problem Relation Age of Onset  . Heart disease Father 36  . Colon cancer Maternal Aunt        26s  . Colon polyps Mother     Social History   Social History Narrative   Married 16 years.Works at [employer].Lives with wife and son.   Social History   Tobacco Use  . Smoking status: Former Research scientist (life sciences)  . Smokeless tobacco: Never Used  . Tobacco comment: only as teenager  Substance Use Topics  . Alcohol use: Yes    Comment: socially, once per month    Current Meds  Medication Sig  . Cholecalciferol (VITAMIN D-3) 125 MCG (5000 UT) TABS Take 2 tablets by mouth daily.   Marland Kitchen  testosterone cypionate (DEPOTESTOSTERONE CYPIONATE) 200 MG/ML injection INJECT 0.5ML INTRAMUSCULARLY TWICE A WEEK.     [place] Office Visit from 11/07/2020 in [facility]  PHQ-9 Total Score 0      Objective:   Today's Vitals: BP 130/80   Pulse 72   Ht 5\' 10"  (1.778 m)   Wt 167 lb 3.2 oz (75.8 kg)   BMI 23.99 kg/m  Vitals with BMI 05/08/2021 11/07/2020 05/06/2020  Height 5\' 10"  5\' 10"  5\' 10"   Weight 167 lbs 3 oz 162 lbs 13 oz 167 lbs 13 oz  BMI 23.99 37.10 62.69  Systolic 485 462 703  Diastolic 80 78 75  Pulse 72 84 94     Physical Exam  Looks systemically well.  He has gained about 5 pounds since November.  Blood pressure is in a good range.     Assessment   1. Vitamin D deficiency disease   2. Decreased libido       Tests ordered No orders of the defined types were placed in this encounter.    Plan: 1. Continue with vitamin D3 10,000 units daily. 2. Continue with testosterone therapy twice a week as before. 3. I will see him in November for an annual physical exam.   No orders of the defined types were placed in this encounter.  Doree Albee, MD

## 2021-05-15 ENCOUNTER — Telehealth: Payer: Self-pay

## 2021-05-15 ENCOUNTER — Other Ambulatory Visit: Payer: Self-pay | Admitting: Podiatry

## 2021-05-15 DIAGNOSIS — M79671 Pain in right foot: Secondary | ICD-10-CM | POA: Diagnosis not present

## 2021-05-15 DIAGNOSIS — M722 Plantar fascial fibromatosis: Secondary | ICD-10-CM | POA: Diagnosis not present

## 2021-05-15 NOTE — Telephone Encounter (Signed)
Kyle Atkins called to cancel his surgery with Dr. Sherryle Lis on 05/23/2021. He stated he just doesn't want to do it right now. Notified Dr. Sherryle Lis and Caren Griffins with [facility].

## 2021-05-16 NOTE — Telephone Encounter (Signed)
Thanks

## 2021-05-20 ENCOUNTER — Telehealth (INDEPENDENT_AMBULATORY_CARE_PROVIDER_SITE_OTHER): Payer: Self-pay

## 2021-05-20 NOTE — Telephone Encounter (Signed)
Transition Care Management Follow-up Telephone Call  Date of discharge and from where: 05/15/21 @ 9:30AM.  How have you been since you were released from the hospital? [place]    Any questions or concerns? 2ND OPTION FOR THE FOOT . WENT TO [facility]. HAD APPT NOTHING CRAZY.  Items Reviewed:  Did the pt receive and understand the discharge instructions provided? Yes   Medications obtained and verified? No   Other? No   Any new allergies since your discharge? No   Dietary orders reviewed? No  Do you have support at home? No   Home Care and Equipment/Supplies: Were home health services ordered? no If so, what is the name of the agency? NO  Has the agency set up a time to come to the patient's home? not applicable Were any new equipment or medical supplies ordered?  No What is the name of the medical supply agency? N/A Were you able to get the supplies/equipment? not applicable Do you have any questions related to the use of the equipment or supplies? No  Functional Questionnaire: (I = Independent and D = Dependent) ADLs: I   Bathing/Dressing- I  Meal Prep- I  Eating- I  Maintaining continence- I  Transferring/Ambulation- I  Managing Meds- I  Follow up appointments reviewed:   PCP Hospital f/u appt confirmed? Yes  Scheduled to see DR Anastasio Champion on 11/11/2021, [place] @ 10:00AM.  [facility] f/u appt confirmed? Yes  Scheduled to see DR Vickki Muff, FOR SURGERY ON FOOT& ANKLE on TARSAL TUNNEL REPAIR @ HOSPITAL.  Are transportation arrangements needed? No   If their condition worsens, is the pt aware to call PCP or go to the Emergency Dept.? No  Was the patient provided with contact information for the PCP's office or ED? Yes  Was to pt encouraged to call back with questions or concerns? Yes

## 2021-05-27 ENCOUNTER — Encounter: Payer: Self-pay | Admitting: Podiatry

## 2021-05-29 ENCOUNTER — Encounter: Payer: BC Managed Care – PPO | Admitting: Podiatry

## 2021-06-05 ENCOUNTER — Encounter: Payer: BC Managed Care – PPO | Admitting: Podiatry

## 2021-06-11 ENCOUNTER — Ambulatory Visit
Admission: RE | Admit: 2021-06-11 | Discharge: 2021-06-11 | Disposition: A | Discharge status: Home / Self Care | Payer: BC Managed Care – PPO | Attending: Podiatry | Admitting: Podiatry

## 2021-06-11 ENCOUNTER — Other Ambulatory Visit: Payer: Self-pay

## 2021-06-11 ENCOUNTER — Encounter: Payer: Self-pay | Admitting: Podiatry

## 2021-06-11 ENCOUNTER — Encounter
Admission: RE | Disposition: A | Discharge status: Home / Self Care | Payer: Self-pay | Source: Home / Self Care | Attending: Podiatry

## 2021-06-11 ENCOUNTER — Ambulatory Visit: Payer: BC Managed Care – PPO | Admitting: Anesthesiology

## 2021-06-11 DIAGNOSIS — M722 Plantar fascial fibromatosis: Secondary | ICD-10-CM | POA: Insufficient documentation

## 2021-06-11 DIAGNOSIS — G5751 Tarsal tunnel syndrome, right lower limb: Secondary | ICD-10-CM | POA: Insufficient documentation

## 2021-06-11 DIAGNOSIS — Z87891 Personal history of nicotine dependence: Secondary | ICD-10-CM | POA: Insufficient documentation

## 2021-06-11 HISTORY — PX: TARSAL TUNNEL RELEASE: SHX5042

## 2021-06-11 HISTORY — PX: PLANTAR FASCIA RELEASE: SHX2239

## 2021-06-11 SURGERY — RELEASE, TARSAL TUNNEL
Anesthesia: General | Site: Foot | Laterality: Right

## 2021-06-11 MED ORDER — FENTANYL CITRATE (PF) 100 MCG/2ML IJ SOLN
INTRAMUSCULAR | Status: DC | PRN
  Administered 2021-06-11 (×2): 50 ug via INTRAVENOUS

## 2021-06-11 MED ORDER — ACETAMINOPHEN 10 MG/ML IV SOLN
INTRAVENOUS | Status: DC | PRN
  Administered 2021-06-11: 1000 mg via INTRAVENOUS

## 2021-06-11 MED ORDER — ACETAMINOPHEN 325 MG PO TABS: 325.0000 mg | ORAL_TABLET | ORAL | Status: DC | PRN

## 2021-06-11 MED ORDER — LACTATED RINGERS IV SOLN: INTRAVENOUS | Status: DC

## 2021-06-11 MED ORDER — MIDAZOLAM HCL 5 MG/5ML IJ SOLN
INTRAMUSCULAR | Status: DC | PRN
  Administered 2021-06-11: 2 mg via INTRAVENOUS

## 2021-06-11 MED ORDER — BUPIVACAINE LIPOSOME 1.3 % IJ SUSP
INTRAMUSCULAR | Status: DC | PRN
  Administered 2021-06-11 (×2): 10 mL

## 2021-06-11 MED ORDER — HYDROMORPHONE HCL 1 MG/ML IJ SOLN: 0.2500 mg | INTRAMUSCULAR | Status: DC | PRN

## 2021-06-11 MED ORDER — PROPOFOL 10 MG/ML IV BOLUS
INTRAVENOUS | Status: DC | PRN
  Administered 2021-06-11: 40 mg via INTRAVENOUS

## 2021-06-11 MED ORDER — GLYCOPYRROLATE 0.2 MG/ML IJ SOLN
INTRAMUSCULAR | Status: DC | PRN
  Administered 2021-06-11: .2 mg via INTRAVENOUS

## 2021-06-11 MED ORDER — LIDOCAINE HCL (CARDIAC) PF 100 MG/5ML IV SOSY
PREFILLED_SYRINGE | INTRAVENOUS | Status: DC | PRN
  Administered 2021-06-11: 60 mg via INTRATRACHEAL

## 2021-06-11 MED ORDER — BUPIVACAINE-EPINEPHRINE (PF) 0.25% -1:200000 IJ SOLN
INTRAMUSCULAR | Status: DC | PRN
  Administered 2021-06-11: 10 mL via PERINEURAL

## 2021-06-11 MED ORDER — POVIDONE-IODINE 7.5 % EX SOLN: Freq: Once | CUTANEOUS | Status: AC

## 2021-06-11 MED ORDER — DEXAMETHASONE SODIUM PHOSPHATE 4 MG/ML IJ SOLN
INTRAMUSCULAR | Status: DC | PRN
  Administered 2021-06-11: 4 mg via INTRAVENOUS

## 2021-06-11 MED ORDER — SODIUM CHLORIDE 0.9 % IR SOLN
Status: DC | PRN
  Administered 2021-06-11: 500 mL

## 2021-06-11 MED ORDER — ONDANSETRON HCL 4 MG/2ML IJ SOLN
INTRAMUSCULAR | Status: DC | PRN
  Administered 2021-06-11: 4 mg via INTRAVENOUS

## 2021-06-11 MED ORDER — OXYCODONE HCL 5 MG/5ML PO SOLN: 5.0000 mg | Freq: Once | ORAL | Status: DC | PRN

## 2021-06-11 MED ORDER — OXYCODONE HCL 5 MG PO TABS: 5.0000 mg | ORAL_TABLET | Freq: Once | ORAL | Status: DC | PRN

## 2021-06-11 MED ORDER — ONDANSETRON HCL 4 MG/2ML IJ SOLN: 4.0000 mg | Freq: Once | INTRAMUSCULAR | Status: DC | PRN

## 2021-06-11 MED ORDER — ACETAMINOPHEN 160 MG/5ML PO SOLN: 325.0000 mg | ORAL | Status: DC | PRN

## 2021-06-11 MED ORDER — OXYCODONE-ACETAMINOPHEN 5-325 MG PO TABS: 1.0000 | ORAL_TABLET | Freq: Four times a day (QID) | ORAL | 0 refills | Status: DC | PRN

## 2021-06-11 MED ORDER — CEFAZOLIN SODIUM-DEXTROSE 2-4 GM/100ML-% IV SOLN
2.0000 g | INTRAVENOUS | Status: AC
  Administered 2021-06-11: 2 g via INTRAVENOUS

## 2021-06-11 SURGICAL SUPPLY — 49 items
APL SKNCLS STERI-STRIP NONHPOA (GAUZE/BANDAGES/DRESSINGS) ×1
BENZOIN TINCTURE PRP APPL 2/3 (GAUZE/BANDAGES/DRESSINGS) ×2 IMPLANT
BLADE TRIANGLE EPF/EGR ENDO (BLADE) ×2 IMPLANT
BNDG CMPR 75X41 PLY HI ABS (GAUZE/BANDAGES/DRESSINGS) ×1
BNDG COHESIVE 4X5 TAN STRL (GAUZE/BANDAGES/DRESSINGS) ×2 IMPLANT
BNDG ESMARK 4X12 TAN STRL LF (GAUZE/BANDAGES/DRESSINGS) ×2 IMPLANT
BNDG STRETCH 4X75 STRL LF (GAUZE/BANDAGES/DRESSINGS) ×2 IMPLANT
BOOT STEPPER DURA LG (SOFTGOODS) ×2 IMPLANT
CANISTER SUCT 1200ML W/VALVE (MISCELLANEOUS) ×2 IMPLANT
COVER LIGHT HANDLE UNIVERSAL (MISCELLANEOUS) ×4 IMPLANT
CUFF TOURN SGL QUICK 18X4 (TOURNIQUET CUFF) ×2 IMPLANT
DURAPREP 26ML APPLICATOR (WOUND CARE) ×2 IMPLANT
ELECT REM PT RETURN 9FT ADLT (ELECTROSURGICAL) ×2
ELECTRODE REM PT RTRN 9FT ADLT (ELECTROSURGICAL) ×1 IMPLANT
GAUZE SPONGE 4X4 12PLY STRL (GAUZE/BANDAGES/DRESSINGS) ×2 IMPLANT
GAUZE XEROFORM 1X8 LF (GAUZE/BANDAGES/DRESSINGS) ×2 IMPLANT
GAUZE XEROFORM 5X9 LF (GAUZE/BANDAGES/DRESSINGS) ×2 IMPLANT
GLOVE SURG ENC MOIS LTX SZ7.5 (GLOVE) ×2 IMPLANT
GOWN STRL REUS W/ TWL LRG LVL3 (GOWN DISPOSABLE) ×2 IMPLANT
GOWN STRL REUS W/TWL LRG LVL3 (GOWN DISPOSABLE) ×4
IV NS 250ML (IV SOLUTION) ×2
IV NS 250ML BAXH (IV SOLUTION) ×1 IMPLANT
K-WIRE DBL END TROCAR 6X.062 (WIRE)
KIT CARPAL TUNNEL (MISCELLANEOUS) ×2
KIT PRC PRB RTRGD 3ANG KNF HND (MISCELLANEOUS) ×1 IMPLANT
KIT TURNOVER KIT A (KITS) ×2 IMPLANT
KWIRE DBL END TROCAR 6X.062 (WIRE) IMPLANT
NEEDLE 18GX1X1/2 (RX/OR ONLY) (NEEDLE) ×4 IMPLANT
NEEDLE HYPO 25GX1X1/2 BEV (NEEDLE) ×2 IMPLANT
NS IRRIG 500ML POUR BTL (IV SOLUTION) ×2 IMPLANT
PACK EXTREMITY ARMC (MISCELLANEOUS) ×2 IMPLANT
SOL ANTI-FOG 6CC FOG-OUT (MISCELLANEOUS) ×1 IMPLANT
SOL FOG-OUT ANTI-FOG 6CC (MISCELLANEOUS) ×1
SPLINT CAST 1 STEP 4X30 (MISCELLANEOUS) ×2 IMPLANT
STOCKINETTE IMPERVIOUS LG (DRAPES) ×2 IMPLANT
STRIP CLOSURE SKIN 1/4X4 (GAUZE/BANDAGES/DRESSINGS) ×2 IMPLANT
SUT ETHILON 3-0 (SUTURE) ×2 IMPLANT
SUT ETHILON 4-0 (SUTURE)
SUT ETHILON 4-0 FS2 18XMFL BLK (SUTURE)
SUT ETHILON 5-0 FS-2 18 BLK (SUTURE) IMPLANT
SUT MNCRL 4-0 (SUTURE)
SUT MNCRL 4-0 27XMFL (SUTURE)
SUT VIC AB 4-0 FS2 27 (SUTURE) IMPLANT
SUT VICRYL AB 3-0 FS1 BRD 27IN (SUTURE) IMPLANT
SUTURE ETHLN 4-0 FS2 18XMF BLK (SUTURE) IMPLANT
SUTURE MNCRL 4-0 27XMF (SUTURE) IMPLANT
SYR 20ML LL LF (SYRINGE) ×2 IMPLANT
WAND TENDON TOPAZ 0 ANGL (MISCELLANEOUS) IMPLANT
WAND TOPAZ MICRO DEBRIDER (MISCELLANEOUS) ×2 IMPLANT

## 2021-06-11 NOTE — Anesthesia Preprocedure Evaluation (Signed)
Anesthesia Evaluation  Patient identified by MRN, date of birth, ID band Patient awake    Reviewed: Allergy & Precautions, H&P , NPO status , Patient's Chart, lab work & pertinent test results, reviewed documented beta blocker date and time   Airway Mallampati: II  TM Distance: >3 FB Neck ROM: full    Dental no notable dental hx.    Pulmonary former smoker,    Pulmonary exam normal breath sounds clear to auscultation       Cardiovascular Exercise Tolerance: Good negative cardio ROS   Rhythm:regular Rate:Normal     Neuro/Psych negative neurological ROS  negative psych ROS   GI/Hepatic negative GI ROS, Neg liver ROS,   Endo/Other  negative endocrine ROS  Renal/GU negative Renal ROS  negative genitourinary   Musculoskeletal   Abdominal   Peds  Hematology negative hematology ROS (+)   Anesthesia Other Findings   Reproductive/Obstetrics negative OB ROS                             Anesthesia Physical Anesthesia Plan  ASA: 2  Anesthesia Plan: General   Post-op Pain Management:    Induction:   PONV Risk Score and Plan:   Airway Management Planned:   Additional Equipment:   Intra-op Plan:   Post-operative Plan:   Informed Consent: I have reviewed the patients History and Physical, chart, labs and discussed the procedure including the risks, benefits and alternatives for the proposed anesthesia with the patient or authorized representative who has indicated his/her understanding and acceptance.     Dental Advisory Given  Plan Discussed with: CRNA and Anesthesiologist  Anesthesia Plan Comments:         Anesthesia Quick Evaluation

## 2021-06-11 NOTE — Op Note (Signed)
Operative note   Surgeon:Caliber Landess Lawyer: None    Preop diagnosis: 1.  Plantar fasciitis right heel 2.  Distal tarsal tunnel syndrome right lower extremity    Postop diagnosis: Same    Procedure: 1.  Endoscopic plantar fasciotomy right heel 2 distal tarsal tunnel release right medial ankle and heel    EBL: Minimal    Anesthesia:local and general local consisted of a total of 10 mL of 0.25% bupivacaine and 20 mL of Exparel long-acting anesthetic    Hemostasis: Mid calf tourniquet inflated to 200 mmHg for approximately 35 minutes    Specimen: None    Complications: None    Operative indications:Kyle Atkins is an 46 y.o. that presents today for surgical intervention.  The risks/benefits/alternatives/complications have been discussed and consent has been given.    Procedure:  Patient was brought into the OR and placed on the operating table in thesupine position. After anesthesia was obtained theright lower extremity was prepped and draped in usual sterile fashion.  Attention was directed to the plantar medial aspect of the right heel where a medial incision was performed.  This was a small stab incision at the level of the plantar fascial insertion.  Sharp and blunt dissection carried down to the fascia.  The fascial elevator was then used to free the fascia from the plantar tissue.  Next the cannula and trocar was then entered into the medial aspect and a small stab incision was made laterally.  The trocar was removed.  The plantar fascial was initially evaluated.  The medial 1/2-2/3 of the ligament were mapped out.  Next with a small triangular blade I was able to incise the fascia.  The deep musculature was noted at this time throughout.  Good release was noted.  The wound was flushed with copious amounts of irrigation.  Blunt trocar was reintroduced and the trocar and cannula were removed.  Attention was then directed to the medial aspect of the right heel where the  incision was made more proximal along the medial aspect and medial branch of the distal ileal nerve.  Sharp and blunt dissection carried down to the superficial fascia.  This was then incised.  The musculature was noted.  This was retracted both dorsal and plantar and the deeper fascial layer was noted and incised.  Blunt dissection carried down to the deeper fat tissue surrounding the first branch of the lateral lateral plantar nerve.  Good release was noted at this time.  The wound was flushed with copious amounts of irrigation.  Closure was then performed with 4-0 Vicryl for the subcutaneous tissue and a 3-0 nylon for the skin.  Finally a small percutaneous stabs were made with an 18-gauge needle under the plantar medial aspect of the heel.  Using a Topaz wand this was then introduced into the residual plantar fascial ligament.  A bulky sterile dressing was applied and the patient was placed at neutral 90 degree in a equalizer walker boot.    Patient tolerated the procedure and anesthesia well.  Was transported from the OR to the PACU with all vital signs stable and vascular status intact. To be discharged per routine protocol.  Will follow up in approximately 1 week in the outpatient clinic.

## 2021-06-11 NOTE — Transfer of Care (Signed)
Immediate Anesthesia Transfer of Care Note  Patient: Kyle Atkins  Procedure(s) Performed: TARSAL TUNNEL RELEASE (Right: Foot) ENDOSCOPIC PLANTAR FASCIA RELEASE (Right: Foot)  Patient Location: PACU  Anesthesia Type: General  Level of Consciousness: awake, alert  and patient cooperative  Airway and Oxygen Therapy: Patient Spontanous Breathing and Patient connected to supplemental oxygen  Post-op Assessment: Post-op Vital signs reviewed, Patient's Cardiovascular Status Stable, Respiratory Function Stable, Patent Airway and No signs of Nausea or vomiting  Post-op Vital Signs: Reviewed and stable  Complications: No notable events documented.

## 2021-06-11 NOTE — Discharge Instructions (Signed)
[facility]  POST OPERATIVE INSTRUCTIONS FOR DR. FOWLER AND [facility] POD[facility]   Take your medication as prescribed.  Pain medication should be taken only as needed.  Keep the dressing clean, dry and intact.  Keep your foot elevated above the heart level for the first 48 hours.  Walking to the bathroom and brief periods of walking are acceptable, unless we have instructed you to be non-weight bearing.  Always wear your post-op shoe when walking.  Always use your crutches if you are to be non-weight bearing.  Do not take a shower. Baths are permissible as long as the foot is kept out of the water.   Every hour you are awake:  Bend your knee 15 times. Flex foot 15 times Massage calf 15 times  Call [facility] ([phone]) if any of the following problems occur: You develop a temperature or fever. The bandage becomes saturated with blood. Medication does not stop your pain. Injury of the foot occurs. Any symptoms of infection including redness, odor, or red streaks running from wound. 

## 2021-06-11 NOTE — Anesthesia Procedure Notes (Addendum)
Procedure Name: LMA Insertion Date/Time: 06/11/2021 12:27 PM Performed by: Dionne Bucy, CRNA Pre-anesthesia Checklist: Patient identified, Patient being monitored, Timeout performed, Emergency Drugs available and Suction available Patient Re-evaluated:Patient Re-evaluated prior to induction Oxygen Delivery Method: Circle system utilized Preoxygenation: Pre-oxygenation with 100% oxygen Induction Type: IV induction Ventilation: Mask ventilation without difficulty LMA: LMA inserted LMA Size: 4.0 Tube type: Oral Number of attempts: 1 Placement Confirmation: positive ETCO2 and breath sounds checked- equal and bilateral Tube secured with: Tape Dental Injury: Teeth and Oropharynx as per pre-operative assessment

## 2021-06-11 NOTE — H&P (Signed)
HISTORY AND PHYSICAL INTERVAL NOTE:  06/11/2021  12:14 PM  Kyle Atkins  has presented today for surgery, with the diagnosis of M72.2 - Plantar fasciitis.  The various methods of treatment have been discussed with the patient.  No guarantees were given.  After consideration of risks, benefits and other options for treatment, the patient has consented to surgery.  I have reviewed the patients' chart and labs.     A history and physical examination was performed in my office.  The patient was reexamined.  There have been no changes to this history and physical examination.  Kyle Atkins A

## 2021-06-11 NOTE — Anesthesia Postprocedure Evaluation (Signed)
Anesthesia Post Note  Patient: Kyle Atkins  Procedure(s) Performed: TARSAL TUNNEL RELEASE (Right: Foot) ENDOSCOPIC PLANTAR FASCIA RELEASE (Right: Foot)     Patient location during evaluation: PACU Anesthesia Type: General Level of consciousness: awake and alert Pain management: pain level controlled Vital Signs Assessment: post-procedure vital signs reviewed and stable Respiratory status: spontaneous breathing, nonlabored ventilation, respiratory function stable and patient connected to nasal cannula oxygen Cardiovascular status: blood pressure returned to baseline and stable Postop Assessment: no apparent nausea or vomiting Anesthetic complications: no   No notable events documented.  Trecia Rogers

## 2021-06-13 ENCOUNTER — Encounter: Payer: Self-pay | Admitting: Podiatry

## 2021-06-19 ENCOUNTER — Encounter: Payer: BC Managed Care – PPO | Admitting: Podiatry

## 2021-07-15 ENCOUNTER — Ambulatory Visit
Admission: EM | Admit: 2021-07-15 | Discharge: 2021-07-15 | Disposition: A | Discharge status: Home / Self Care | Payer: BC Managed Care – PPO | Attending: Family Medicine | Admitting: Family Medicine

## 2021-07-15 DIAGNOSIS — S81012A Laceration without foreign body, left knee, initial encounter: Secondary | ICD-10-CM

## 2021-07-15 NOTE — ED Triage Notes (Signed)
Pt presents with laceration to left knee from piece of sheet metal at work. Tetanus within 4 years

## 2021-07-15 NOTE — ED Provider Notes (Signed)
  [place]   [id number] 07/15/21 Arrival Time: [id number]  ASSESSMENT & PLAN:  1. Knee laceration, left, initial encounter    Procedure: Verbal consent obtained. Patient provided with risks and alternatives to the procedure. Wound copiously irrigated with NS then cleansed with betadine. Local anesthesia: Lidocaine 2% with epinephrine. Wound carefully explored. No foreign body, tendon injury, or nonviable tissue were noted. Using sterile technique, 6 interrupted 3-0 Prolene sutures were placed to reapproximate the wound. Procedure tolerated well. No complications. Minimal bleeding. Advised to look for and return for any signs of infection such as redness, swelling, discharge, or worsening pain. Return for suture removal in 7-10 days.  See AVS for wound care instructions. Activities as tolerated.  Reviewed expectations re: course of current medical issues. Questions answered. Outlined signs and symptoms indicating need for more acute intervention. Patient verbalized understanding. After Visit Summary given.   SUBJECTIVE:  Kyle Atkins is a 45 y.o. male who presents with a laceration of anterior L knee; today; vs sheet metal; moderate bleeding now controlled; ambulatory. No extremity sensation changes or weakness. Td 4 y ago.  OBJECTIVE:  Vitals:   07/15/21 1627  BP: 124/72  Pulse: 60  Resp: 16  Temp: (!) 97.5 F (36.4 C)  TempSrc: Tympanic  SpO2: 98%    General appearance: alert; no distress Skin: linear laceration of L anterior knee; size: approx 2.8 cm; clean wound edges, no foreign bodies; without active bleeding; knee with FROM; normal distal sensation Psychological: alert and cooperative; normal mood and affect   No Known Allergies  Past Medical History:  Diagnosis Date   Decreased libido 09/19/2019   HLD (hyperlipidemia) 09/19/2019   Vitamin D deficiency disease 09/19/2019   Social History   Socioeconomic History   Marital status: Married    Spouse name:  Not on file   Number of children: 0   Years of education: Not on file   Highest education level: Not on file  Occupational History   Occupation: Arts development officer  Tobacco Use   Smoking status: Former   Smokeless tobacco: Never   Tobacco comments:    only as teenager  Scientific laboratory technician Use: Never used  Substance and Sexual Activity   Alcohol use: Yes    Comment: socially, once per month   Drug use: No   Sexual activity: Yes  Other Topics Concern   Not on file  Social History Narrative   Married 16 years.Works at [employer].Lives with wife and son.   Social Determinants of Health   Financial Resource Strain: Not on file  Food Insecurity: Not on file  Transportation Needs: Not on file  Physical Activity: Not on file  Stress: Not on file  Social Connections: Not on file          Vanessa Kick, MD 07/15/21 1754

## 2021-10-01 ENCOUNTER — Ambulatory Visit: Payer: BC Managed Care – PPO | Admitting: Internal Medicine

## 2021-11-12 ENCOUNTER — Encounter (INDEPENDENT_AMBULATORY_CARE_PROVIDER_SITE_OTHER): Payer: BC Managed Care – PPO | Admitting: Internal Medicine

## 2022-11-19 IMAGING — MR MR HEEL *R* W/O CM
6 series · 40 of 40 positions shown · non-contrast
Comparison: Radiographs 11/12/2020

CLINICAL DATA: Chronic heel pain.

EXAM:
MR OF THE RIGHT HEEL WITHOUT CONTRAST
TECHNIQUE: Multiplanar, multisequence MR imaging of the ankle was performed. No
intravenous contrast was administered.

[Series 5: T2 fat-sat · axial · 3.0mm · 0.59mm/px · z∈[-35,+156]mm · 7 of 50 slices shown (1 of 2)]
[im 1/50]
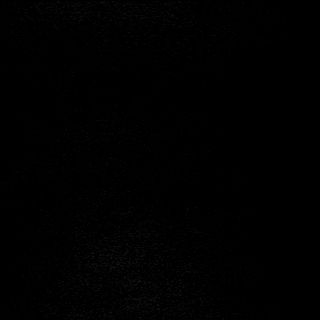
[im 9/50]
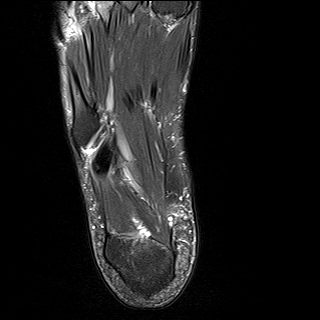
[im 17/50]
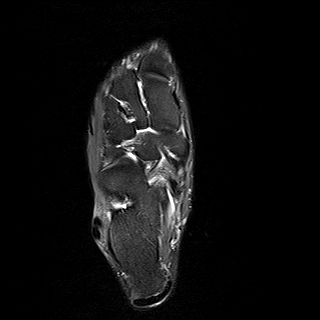
[im 25/50]
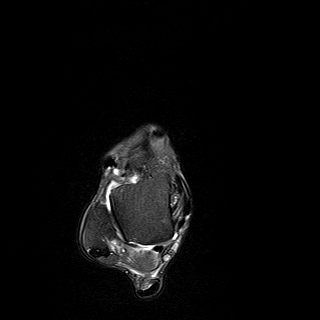
[im 33/50]
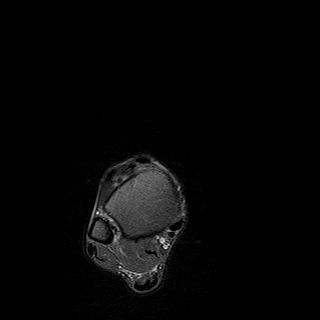
[im 41/50]
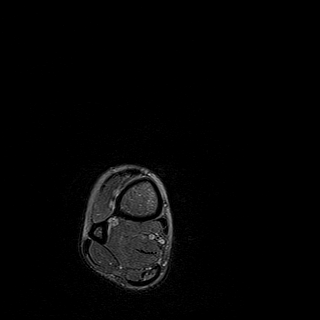
[im 50/50]
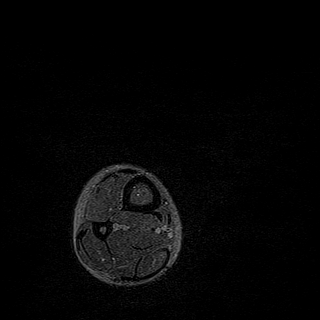

[Series 6: PD fat-sat · axial · 3.0mm · 0.59mm/px · z∈[-35,+156]mm · 7 of 50 slices shown]
[im 1/50]
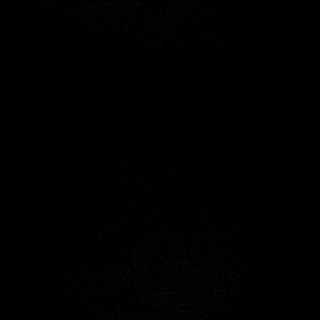
[im 9/50]
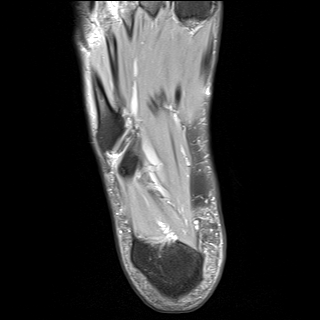
[im 17/50]
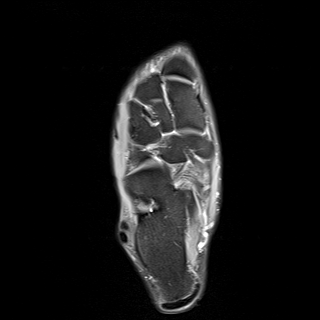
[im 25/50]
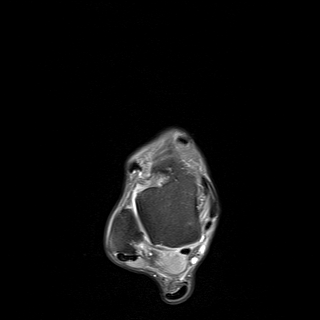
[im 33/50]
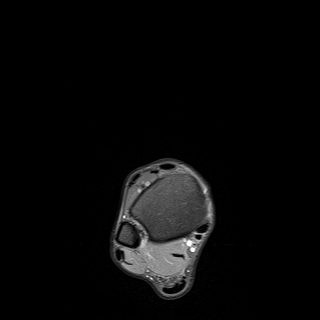
[im 41/50]
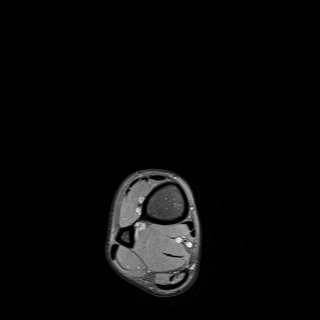
[im 50/50]
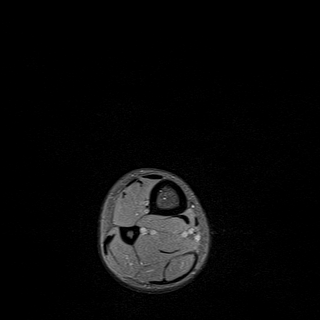

[Series 7: T1 · sagittal · 4.0mm · 0.59mm/px · 5 of 30 slices shown (1 of 2)]
[im 1/30]
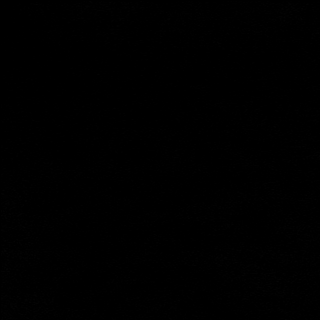
[im 8/30]
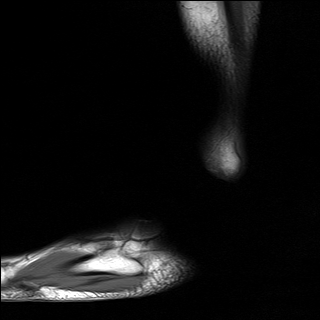
[im 15/30]
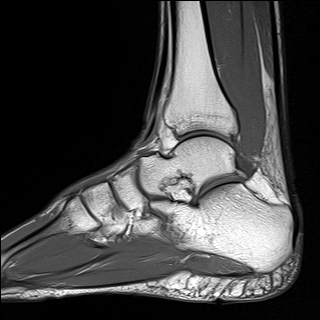
[im 22/30]
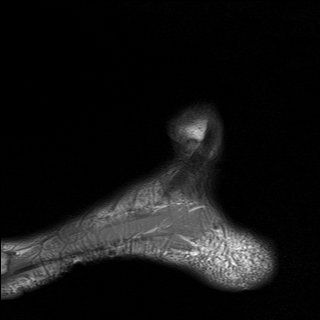
[im 30/30]
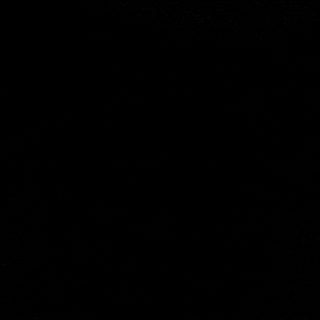

[Series 8: STIR · sagittal · 4.0mm · 0.37mm/px · 5 of 30 slices shown]
[im 1/30]
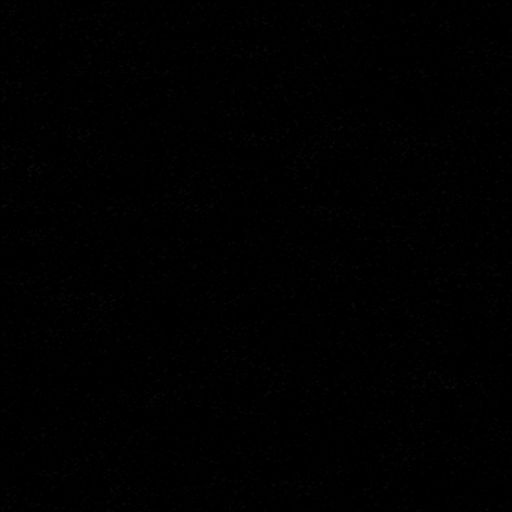
[im 8/30]
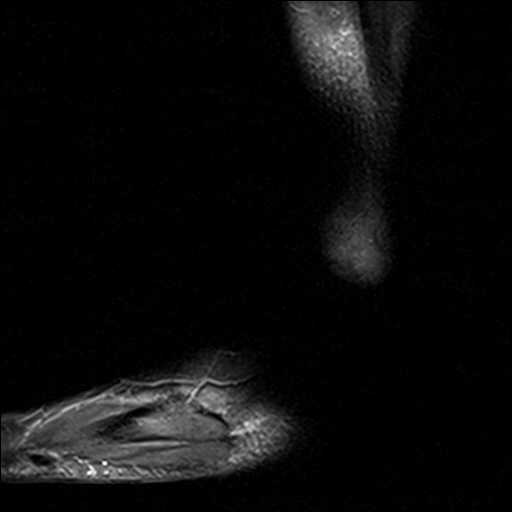
[im 15/30]
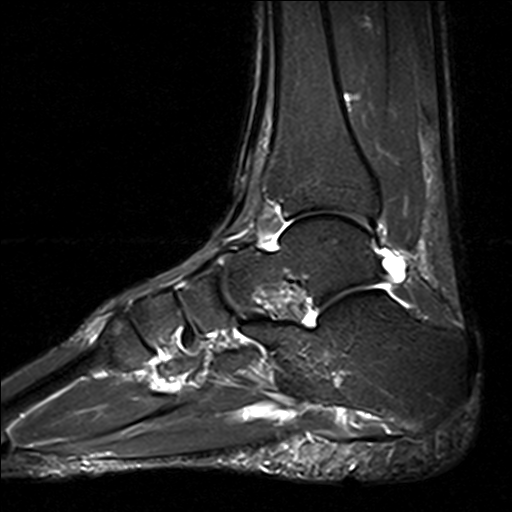
[im 22/30]
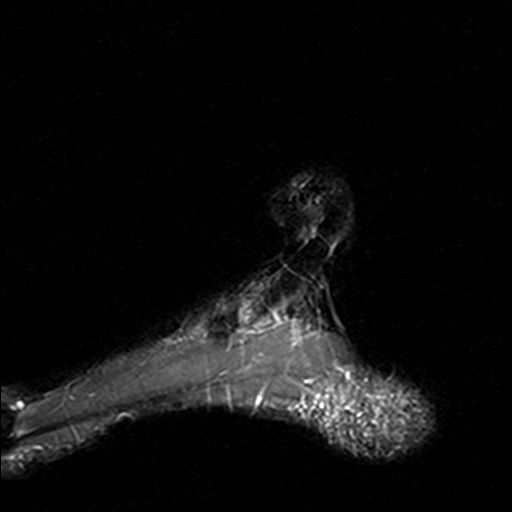
[im 30/30]
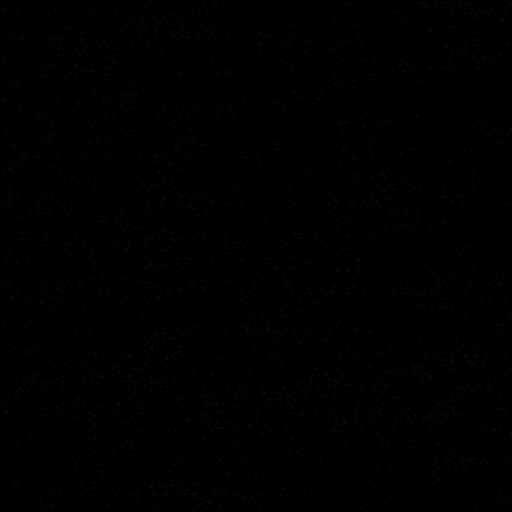

[Series 9: T2 fat-sat · coronal · 3.0mm · 0.59mm/px · 8 of 55 slices shown (2 of 2)]
[im 1/55]
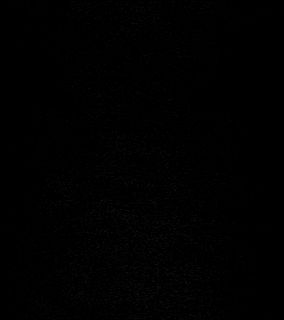
[im 8/55]
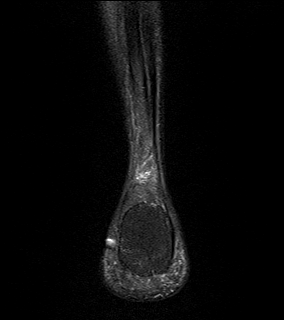
[im 16/55]
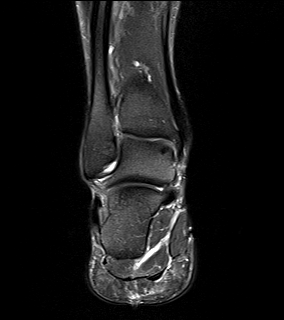
[im 24/55]
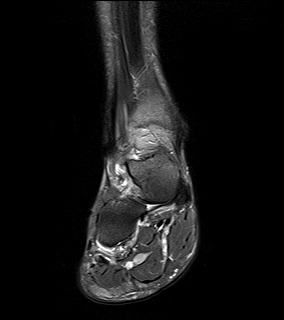
[im 31/55]
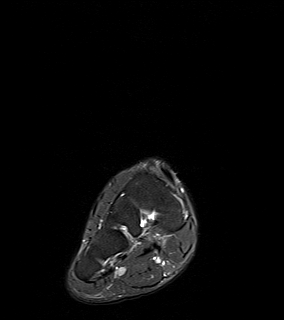
[im 39/55]
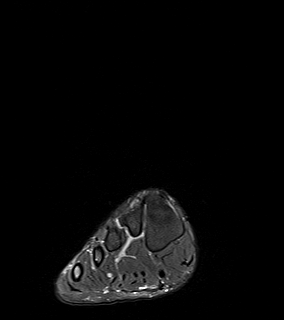
[im 47/55]
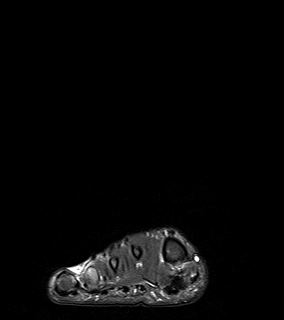
[im 55/55]
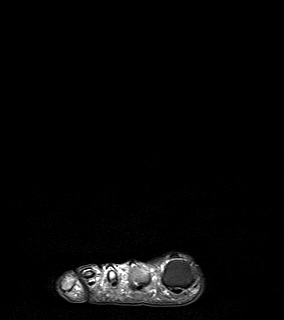

[Series 10: T1 · axial · 3.0mm · 0.74mm/px · z∈[-35,+156]mm · 8 of 50 slices shown (2 of 2)]
[im 1/50]
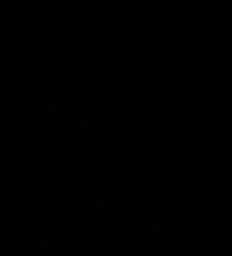
[im 8/50]
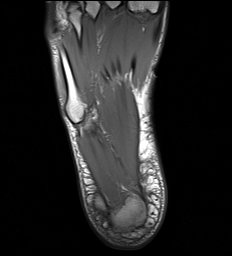
[im 15/50]
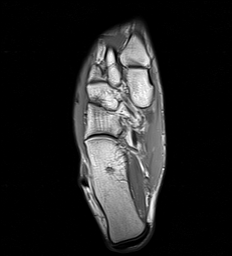
[im 22/50]
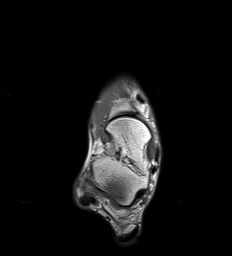
[im 29/50]
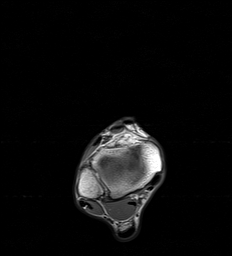
[im 36/50]
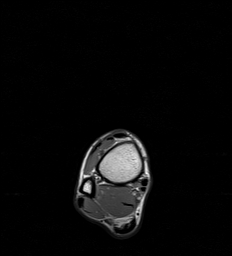
[im 43/50]
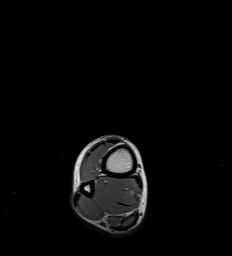
[im 50/50]
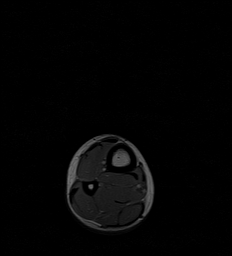

[40 of 40 positions shown; findings below may reference images not displayed]

FINDINGS: TENDONS

Peroneal: Intact. No significant tendinopathy or tenosynovitis. Os
peroneum noted.

Posteromedial: Intact. No significant tendinopathy the or
tenosynovitis.

Anterior: Intact.  No tendinopathy or tenosynovitis.

Achilles: Normal

Plantar Fascia: Mild changes of plantar fasciitis with slight
thickening and increased signal intensity in the central slip. There
is also mild reactive marrow edema at the calcaneal attachment site.
No tear/rupture.

LIGAMENTS

Lateral: Intact

Medial: Intact

CARTILAGE

Ankle Joint: Normal articular cartilage. No joint effusion or
osteochondral lesion.

Subtalar Joints/Sinus Tarsi: The subtalar joints are maintained.
There is a small joint effusion noted involving the posterior
talocalcaneal facet. The sinus tarsi is unremarkable. The cervical
and interosseous ligaments are intact and the spring ligament is
intact.

Bones: No stress fracture, AVN or osteochondral lesion.

Other: Unremarkable foot and ankle musculature.
IMPRESSION: 1. Mild changes of plantar fasciitis but no tear/rupture.
2. Intact medial, lateral and anterior ankle tendons and medial and
lateral ankle ligaments.
3. No stress fracture, AVN or osteochondral lesion.

## 2023-11-02 ENCOUNTER — Encounter: Payer: Self-pay | Admitting: *Deleted

## 2024-09-19 ENCOUNTER — Encounter: Payer: Self-pay | Admitting: Gastroenterology

## 2024-09-20 ENCOUNTER — Ambulatory Visit: Admitting: Gastroenterology

## 2024-09-20 ENCOUNTER — Other Ambulatory Visit: Payer: Self-pay | Admitting: *Deleted

## 2024-09-20 ENCOUNTER — Encounter: Payer: Self-pay | Admitting: Gastroenterology

## 2024-09-20 ENCOUNTER — Encounter: Payer: Self-pay | Admitting: *Deleted

## 2024-09-20 VITALS — BP 128/84 | HR 71 | Temp 98.3°F | Ht 69.0 in | Wt 187.4 lb

## 2024-09-20 DIAGNOSIS — R195 Other fecal abnormalities: Secondary | ICD-10-CM | POA: Diagnosis not present

## 2024-09-20 DIAGNOSIS — Z860102 Personal history of hyperplastic colon polyps: Secondary | ICD-10-CM | POA: Diagnosis not present

## 2024-09-20 DIAGNOSIS — Z860100 Personal history of colon polyps, unspecified: Secondary | ICD-10-CM

## 2024-09-20 DIAGNOSIS — Z8719 Personal history of other diseases of the digestive system: Secondary | ICD-10-CM

## 2024-09-20 MED ORDER — PEG 3350-KCL-NA BICARB-NACL 420 G PO SOLR: 4000.0000 mL | Freq: Once | ORAL | 0 refills | Status: AC

## 2024-09-20 NOTE — H&P (View-Only) (Signed)
 GI Office Note    Referring Provider: Rosan Jacquline NOVAK, NP Primary Care Physician:  Rosan Jacquline NOVAK, NP  Primary Gastroenterologist: Lamar HERO.Rourk, MD  Chief Complaint   Chief Complaint  Patient presents with   positive cologuard     Positive cologuard    History of Present Illness   Kyle Atkins is a 48 y.o. male presenting today at the request of Rosan Jacquline NOVAK, NP for positive FIT test.   Colonoscopy February 2013: - Minimal internal hemorrhoids - Single diminutive rectosigmoid polyp - Remainder of colon and TI normal - Continue fiber supplementation and hydrocortisone  rectal cream - Pathology revealed hyperplastic polyps - Recommend to repeat in 5 years.  Colonoscopy 08/2017: - One 6 mm polyp in the sigmoid colon, removed with a cold snare.  - Internal hemorrhoids.  - The examination was otherwise normal on direct and retroflexion views. - Pathology revealed hyperplastic polyps - Advised repeat in 10 years.  Family history of colon polyps in his mother at age 47. Repots family history of other cancers and a maternal aunt that died from colon cancer.   Today:  Discussed the use of AI scribe software for clinical note transcription with the patient, who gave verbal consent to proceed.  He has not observed any blood in his stool himself. His insurance company sent him a stool test, which he completed and returned and was told it was positive.   He has a history of hemorrhoids and polyps, with his last colonoscopy performed in 2018, during which one polyp and hemorrhoids were noted. He had some bleeding associated with hemorrhoids in 2018, but describes it as 'nothing significant'. He has undergone two previous colonoscopies, the first in 2013 given rectal bleeding and family history concerns, and the second in 2018. He is proactive about his health due to this family history.  No current symptoms such as nausea, vomiting, trouble swallowing, black  stools, changes in bowel habits, appetite, or weight loss. He reports regular bowel movements, typically once a day, with no changes in stool caliber or frequency. No straining.   He does not take any regular medications, including for cholesterol, and has previously taken vitamin D . He reports occasional alcohol use and no tobacco or vaping use.      Wt Readings from Last 5 Encounters:  09/20/24 187 lb 6.4 oz (85 kg)  06/11/21 165 lb 3.2 oz (74.9 kg)  05/08/21 167 lb 3.2 oz (75.8 kg)  11/07/20 162 lb 12.8 oz (73.8 kg)  05/06/20 167 lb 12.8 oz (76.1 kg)    Current Outpatient Medications  Medication Sig Dispense Refill   testosterone  cypionate (DEPOTESTOSTERONE CYPIONATE) 200 MG/ML injection INJECT 0.5ML INTRAMUSCULARLY TWICE A WEEK. 10 mL 2   b complex vitamins capsule Take 1 capsule by mouth daily. (Patient not taking: Reported on 09/20/2024)     Cholecalciferol (VITAMIN D -3) 125 MCG (5000 UT) TABS Take 2 tablets by mouth daily.  (Patient not taking: Reported on 09/20/2024)     oxyCODONE -acetaminophen  (PERCOCET) 5-325 MG tablet Take 1-2 tablets by mouth every 6 (six) hours as needed for severe pain. Max 6 tabs per day (Patient not taking: Reported on 09/20/2024) 30 tablet 0   No current facility-administered medications for this visit.    Past Medical History:  Diagnosis Date   Decreased libido 09/19/2019   HLD (hyperlipidemia) 09/19/2019   Vitamin D  deficiency disease 09/19/2019    Past Surgical History:  Procedure Laterality Date   BACK SURGERY  lower, X2   COLONOSCOPY  02/15/2012   Dr. Shaaron, internal hemorrhoids, hyperplastic polyp.    COLONOSCOPY N/A 09/10/2017   Procedure: COLONOSCOPY;  Surgeon: Shaaron Lamar HERO, MD;  Location: [facility];  Service: Endoscopy;  Laterality: N/A;  2:00pm   HAND SURGERY     right   PLANTAR FASCIA RELEASE Right 06/11/2021   Procedure: ENDOSCOPIC PLANTAR FASCIA RELEASE;  Surgeon: Ashley Soulier, DPM;  Location: [facility];  Service:  Podiatry;  Laterality: Right;   TARSAL TUNNEL RELEASE Right 06/11/2021   Procedure: TARSAL TUNNEL RELEASE;  Surgeon: Ashley Soulier, DPM;  Location: [facility];  Service: Podiatry;  Laterality: Right;    Family History  Problem Relation Age of Onset   Heart disease Father 10   Colon cancer Maternal Aunt        51s   Colon polyps Mother     Allergies as of 09/20/2024   (No Known Allergies)    Social History   Socioeconomic History   Marital status: Married    Spouse name: Not on file   Number of children: 0   Years of education: Not on file   Highest education level: Not on file  Occupational History   Occupation: Set designer  Tobacco Use   Smoking status: Former   Smokeless tobacco: Never   Tobacco comments:    only as teenager  Advertising account planner   Vaping status: Never Used  Substance and Sexual Activity   Alcohol use: Yes    Comment: socially, once per month   Drug use: No   Sexual activity: Yes  Other Topics Concern   Not on file  Social History Narrative   Married 16 years.Works at [employer].Lives with wife and son.   Social Drivers of Corporate investment banker Strain: Not on file  Food Insecurity: Not on file  Transportation Needs: Not on file  Physical Activity: Not on file  Stress: Not on file  Social Connections: Not on file  Intimate Partner Violence: Not on file     Review of Systems   Gen: Denies any fever, chills, fatigue, weight loss, lack of appetite.  CV: Denies chest pain, heart palpitations, peripheral edema, syncope.  Resp: Denies shortness of breath at rest or with exertion. Denies wheezing or cough.  GI: see HPI GU : Denies urinary burning, urinary frequency, urinary hesitancy MS: Denies joint pain, muscle weakness, cramps, or limitation of movement.  Derm: Denies rash, itching, dry skin Psych: Denies depression, anxiety, memory loss, and confusion Heme: Denies bruising, bleeding, and enlarged lymph nodes.  Physical  Exam   BP 128/84 (BP Location: Right Arm, Patient Position: Sitting, Cuff Size: Large)   Pulse 71   Temp 98.3 F (36.8 C) (Temporal)   Ht 5' 9 (1.753 m)   Wt 187 lb 6.4 oz (85 kg)   BMI 27.67 kg/m   General:   Alert and oriented. Pleasant and cooperative. Well-nourished and well-developed.  Head:  Normocephalic and atraumatic. Eyes:  Without icterus, sclera clear and conjunctiva pink.  Ears:  Normal auditory acuity. Mouth:  No deformity or lesions, oral mucosa pink.  Lungs:  Clear to auscultation bilaterally. No wheezes, rales, or rhonchi. No distress.  Heart:  S1, S2 present without murmurs appreciated.  Abdomen:  +BS, soft, non-tender and non-distended. No HSM noted. No guarding or rebound. No masses appreciated.  Rectal:  deferred  Msk:  Symmetrical without gross deformities. Normal posture. Extremities:  Without edema. Neurologic:  Alert and  oriented x4;  grossly normal neurologically. Skin:  Intact without significant lesions or rashes. Psych:  Alert and cooperative. Normal mood and affect.  Assessment & Plan   BURNIS HALLING is a 48 y.o. male with a history of HLD, low testosterone , Vit D and B12 deficiency, colon polyps, and hemorrhoids presenting today at the request of PCP give positive FIT test.     Personal history of colonic polyps and hemorrhoids, recent positive FIT test Colonic polyps and hemorrhoids with a recent positive FIT test. No visible blood in stool, no changes in bowel habits, and no symptoms such as nausea, vomiting, or weight loss. Previous colonoscopies have identified polyps and hemorrhoids. Family history of polyps and colon cancer. The positive FIT could be secondary to hemorrhoids, as no new symptoms are present, unable to completely exclude polyps or malignancy. Colonoscopy is indicated based on history and recent positive FIT test. Underwent hemorrhoid banding in 2018.  - Schedule colonoscopy given positive FIT - Advise against using FIT tests in  the future, can follow guidelines based in recommended follow up unless alarm symptoms arise.  - Monitor for new alarm symptoms such as rectal bleeding, changes in bowel habits, weight loss, or anemia.   Proceed with colonoscopy with propofol  by Dr. Shaaron in near future: the risks, benefits, and alternatives have been discussed with the patient in detail. The patient states understanding and desires to proceed. ASA 2      Follow up   Follow up as needed.    Charmaine Melia, MSN, FNP-BC, AGACNP-BC [facility]

## 2024-09-20 NOTE — Patient Instructions (Signed)
 We will get you scheduled for colonoscopy in the near future with Dr. Shaaron.  It was a pleasure to see you today. I want to create trusting relationships with patients. If you receive a survey regarding your visit,  I greatly appreciate you taking time to fill this out on paper or through your MyChart. I value your feedback.  Charmaine Melia, MSN, FNP-BC, AGACNP-BC [facility]

## 2024-09-20 NOTE — Progress Notes (Signed)
 GI Office Note    Referring Provider: Rosan Jacquline NOVAK, NP Primary Care Physician:  Kyle Jacquline NOVAK, NP  Primary Gastroenterologist: Kyle Atkins.Rourk, MD  Chief Complaint   Chief Complaint  Patient presents with   positive cologuard     Positive cologuard    History of Present Illness   Kyle Atkins is a 48 y.o. male presenting today at the request of Kyle Jacquline NOVAK, NP for positive FIT test.   Colonoscopy February 2013: - Minimal internal hemorrhoids - Single diminutive rectosigmoid polyp - Remainder of colon and TI normal - Continue fiber supplementation and hydrocortisone  rectal cream - Pathology revealed hyperplastic polyps - Recommend to repeat in 5 years.  Colonoscopy 08/2017: - One 6 mm polyp in the sigmoid colon, removed with a cold snare.  - Internal hemorrhoids.  - The examination was otherwise normal on direct and retroflexion views. - Pathology revealed hyperplastic polyps - Advised repeat in 10 years.  Family history of colon polyps in his mother at age 47. Repots family history of other cancers and a maternal aunt that died from colon cancer.   Today:  Discussed the use of AI scribe software for clinical note transcription with the patient, who gave verbal consent to proceed.  He has not observed any blood in his stool himself. His insurance company sent him a stool test, which he completed and returned and was told it was positive.   He has a history of hemorrhoids and polyps, with his last colonoscopy performed in 2018, during which one polyp and hemorrhoids were noted. He had some bleeding associated with hemorrhoids in 2018, but describes it as 'nothing significant'. He has undergone two previous colonoscopies, the first in 2013 given rectal bleeding and family history concerns, and the second in 2018. He is proactive about his health due to this family history.  No current symptoms such as nausea, vomiting, trouble swallowing, black  stools, changes in bowel habits, appetite, or weight loss. He reports regular bowel movements, typically once a day, with no changes in stool caliber or frequency. No straining.   He does not take any regular medications, including for cholesterol, and has previously taken vitamin D . He reports occasional alcohol use and no tobacco or vaping use.      Wt Readings from Last 5 Encounters:  09/20/24 187 lb 6.4 oz (85 kg)  06/11/21 165 lb 3.2 oz (74.9 kg)  05/08/21 167 lb 3.2 oz (75.8 kg)  11/07/20 162 lb 12.8 oz (73.8 kg)  05/06/20 167 lb 12.8 oz (76.1 kg)    Current Outpatient Medications  Medication Sig Dispense Refill   testosterone  cypionate (DEPOTESTOSTERONE CYPIONATE) 200 MG/ML injection INJECT 0.5ML INTRAMUSCULARLY TWICE A WEEK. 10 mL 2   b complex vitamins capsule Take 1 capsule by mouth daily. (Patient not taking: Reported on 09/20/2024)     Cholecalciferol (VITAMIN D -3) 125 MCG (5000 UT) TABS Take 2 tablets by mouth daily.  (Patient not taking: Reported on 09/20/2024)     oxyCODONE -acetaminophen  (PERCOCET) 5-325 MG tablet Take 1-2 tablets by mouth every 6 (six) hours as needed for severe pain. Max 6 tabs per day (Patient not taking: Reported on 09/20/2024) 30 tablet 0   No current facility-administered medications for this visit.    Past Medical History:  Diagnosis Date   Decreased libido 09/19/2019   HLD (hyperlipidemia) 09/19/2019   Vitamin D  deficiency disease 09/19/2019    Past Surgical History:  Procedure Laterality Date   BACK SURGERY  lower, X2   COLONOSCOPY  02/15/2012   Dr. Shaaron, internal hemorrhoids, hyperplastic polyp.    COLONOSCOPY N/A 09/10/2017   Procedure: COLONOSCOPY;  Surgeon: Kyle Kyle HERO, MD;  Location: [facility];  Service: Endoscopy;  Laterality: N/A;  2:00pm   HAND SURGERY     right   PLANTAR FASCIA RELEASE Right 06/11/2021   Procedure: ENDOSCOPIC PLANTAR FASCIA RELEASE;  Surgeon: Kyle Atkins, DPM;  Location: [facility];  Service:  Podiatry;  Laterality: Right;   TARSAL TUNNEL RELEASE Right 06/11/2021   Procedure: TARSAL TUNNEL RELEASE;  Surgeon: Kyle Atkins, DPM;  Location: [facility];  Service: Podiatry;  Laterality: Right;    Family History  Problem Relation Age of Onset   Heart disease Father 10   Colon cancer Maternal Aunt        51s   Colon polyps Mother     Allergies as of 09/20/2024   (No Known Allergies)    Social History   Socioeconomic History   Marital status: Married    Spouse name: Not on file   Number of children: 0   Years of education: Not on file   Highest education level: Not on file  Occupational History   Occupation: Set designer  Tobacco Use   Smoking status: Former   Smokeless tobacco: Never   Tobacco comments:    only as teenager  Advertising account planner   Vaping status: Never Used  Substance and Sexual Activity   Alcohol use: Yes    Comment: socially, once per month   Drug use: No   Sexual activity: Yes  Other Topics Concern   Not on file  Social History Narrative   Married 16 years.Works at [employer].Lives with wife and son.   Social Drivers of Corporate investment banker Strain: Not on file  Food Insecurity: Not on file  Transportation Needs: Not on file  Physical Activity: Not on file  Stress: Not on file  Social Connections: Not on file  Intimate Partner Violence: Not on file     Review of Systems   Gen: Denies any fever, chills, fatigue, weight loss, lack of appetite.  CV: Denies chest pain, heart palpitations, peripheral edema, syncope.  Resp: Denies shortness of breath at rest or with exertion. Denies wheezing or cough.  GI: see HPI GU : Denies urinary burning, urinary frequency, urinary hesitancy MS: Denies joint pain, muscle weakness, cramps, or limitation of movement.  Derm: Denies rash, itching, dry skin Psych: Denies depression, anxiety, memory loss, and confusion Heme: Denies bruising, bleeding, and enlarged lymph nodes.  Physical  Exam   BP 128/84 (BP Location: Right Arm, Patient Position: Sitting, Cuff Size: Large)   Pulse 71   Temp 98.3 F (36.8 C) (Temporal)   Ht 5' 9 (1.753 m)   Wt 187 lb 6.4 oz (85 kg)   BMI 27.67 kg/m   General:   Alert and oriented. Pleasant and cooperative. Well-nourished and well-developed.  Head:  Normocephalic and atraumatic. Eyes:  Without icterus, sclera clear and conjunctiva pink.  Ears:  Normal auditory acuity. Mouth:  No deformity or lesions, oral mucosa pink.  Lungs:  Clear to auscultation bilaterally. No wheezes, rales, or rhonchi. No distress.  Heart:  S1, S2 present without murmurs appreciated.  Abdomen:  +BS, soft, non-tender and non-distended. No HSM noted. No guarding or rebound. No masses appreciated.  Rectal:  deferred  Msk:  Symmetrical without gross deformities. Normal posture. Extremities:  Without edema. Neurologic:  Alert and  oriented x4;  grossly normal neurologically. Skin:  Intact without significant lesions or rashes. Psych:  Alert and cooperative. Normal mood and affect.  Assessment & Plan   Kyle Atkins is a 48 y.o. male with a history of HLD, low testosterone , Vit D and B12 deficiency, colon polyps, and hemorrhoids presenting today at the request of PCP give positive FIT test.     Personal history of colonic polyps and hemorrhoids, recent positive FIT test Colonic polyps and hemorrhoids with a recent positive FIT test. No visible blood in stool, no changes in bowel habits, and no symptoms such as nausea, vomiting, or weight loss. Previous colonoscopies have identified polyps and hemorrhoids. Family history of polyps and colon cancer. The positive FIT could be secondary to hemorrhoids, as no new symptoms are present, unable to completely exclude polyps or malignancy. Colonoscopy is indicated based on history and recent positive FIT test. Underwent hemorrhoid banding in 2018.  - Schedule colonoscopy given positive FIT - Advise against using FIT tests in  the future, can follow guidelines based in recommended follow up unless alarm symptoms arise.  - Monitor for new alarm symptoms such as rectal bleeding, changes in bowel habits, weight loss, or anemia.   Proceed with colonoscopy with propofol  by Dr. Shaaron in near future: the risks, benefits, and alternatives have been discussed with the patient in detail. The patient states understanding and desires to proceed. ASA 2      Follow up   Follow up as needed.    Charmaine Melia, MSN, FNP-BC, AGACNP-BC [facility]

## 2024-10-16 ENCOUNTER — Ambulatory Visit (HOSPITAL_COMMUNITY)
Admission: RE | Admit: 2024-10-16 | Discharge: 2024-10-16 | Disposition: A | Discharge status: Home / Self Care | Attending: Internal Medicine | Admitting: Internal Medicine

## 2024-10-16 ENCOUNTER — Encounter (HOSPITAL_COMMUNITY)
Admission: RE | Disposition: A | Discharge status: Home / Self Care | Payer: Self-pay | Source: Home / Self Care | Attending: Internal Medicine

## 2024-10-16 ENCOUNTER — Ambulatory Visit (HOSPITAL_COMMUNITY)

## 2024-10-16 ENCOUNTER — Other Ambulatory Visit: Payer: Self-pay

## 2024-10-16 ENCOUNTER — Encounter (HOSPITAL_COMMUNITY): Payer: Self-pay | Admitting: Internal Medicine

## 2024-10-16 DIAGNOSIS — Z1211 Encounter for screening for malignant neoplasm of colon: Secondary | ICD-10-CM

## 2024-10-16 DIAGNOSIS — Z83719 Family history of colon polyps, unspecified: Secondary | ICD-10-CM | POA: Diagnosis not present

## 2024-10-16 DIAGNOSIS — K635 Polyp of colon: Secondary | ICD-10-CM

## 2024-10-16 DIAGNOSIS — Z800 Family history of malignant neoplasm of digestive organs: Secondary | ICD-10-CM | POA: Diagnosis not present

## 2024-10-16 DIAGNOSIS — D125 Benign neoplasm of sigmoid colon: Secondary | ICD-10-CM | POA: Diagnosis not present

## 2024-10-16 DIAGNOSIS — K648 Other hemorrhoids: Secondary | ICD-10-CM | POA: Insufficient documentation

## 2024-10-16 DIAGNOSIS — R195 Other fecal abnormalities: Secondary | ICD-10-CM | POA: Insufficient documentation

## 2024-10-16 DIAGNOSIS — Z860100 Personal history of colon polyps, unspecified: Secondary | ICD-10-CM

## 2024-10-16 DIAGNOSIS — Z87891 Personal history of nicotine dependence: Secondary | ICD-10-CM | POA: Diagnosis not present

## 2024-10-16 HISTORY — PX: COLONOSCOPY: SHX5424

## 2024-10-16 SURGERY — COLONOSCOPY
Anesthesia: General

## 2024-10-16 MED ORDER — LIDOCAINE 2% (20 MG/ML) 5 ML SYRINGE
INTRAMUSCULAR | Status: DC | PRN
  Administered 2024-10-16: 60 mg via INTRAVENOUS

## 2024-10-16 MED ORDER — PROPOFOL 10 MG/ML IV BOLUS
INTRAVENOUS | Status: DC | PRN
  Administered 2024-10-16: 150 ug/kg/min via INTRAVENOUS
  Administered 2024-10-16: 100 mg via INTRAVENOUS

## 2024-10-16 MED ORDER — LACTATED RINGERS IV SOLN: INTRAVENOUS | Status: DC | PRN

## 2024-10-16 NOTE — Interval H&P Note (Signed)
 History and Physical Interval Note:  10/16/2024 8:52 AM  Kyle Atkins  has presented today for surgery, with the diagnosis of positive FIT, history of polyps.  The various methods of treatment have been discussed with the patient and family. After consideration of risks, benefits and other options for treatment, the patient has consented to  Procedure(s) with comments: COLONOSCOPY (N/A) - 9:15 am, asa 2 as a surgical intervention.  The patient's history has been reviewed, patient examined, no change in status, stable for surgery.  I have reviewed the patient's chart and labs.  Questions were answered to the patient's satisfaction.     [name]   positive  FIT.

## 2024-10-16 NOTE — Discharge Instructions (Signed)
  Colonoscopy Discharge Instructions  Read the instructions outlined below and refer to this sheet in the next few weeks. These discharge instructions provide you with general information on caring for yourself after you leave the hospital. Your doctor may also give you specific instructions. While your treatment has been planned according to the most current medical practices available, unavoidable complications occasionally occur. If you have any problems or questions after discharge, call Dr. Shaaron at [phone]. ACTIVITY You may resume your regular activity, but move at a slower pace for the next 24 hours.  Take frequent rest periods for the next 24 hours.  Walking will help get rid of the air and reduce the bloated feeling in your belly (abdomen).  No driving for 24 hours (because of the medicine (anesthesia) used during the test).   Do not sign any important legal documents or operate any machinery for 24 hours (because of the anesthesia used during the test).  NUTRITION Drink plenty of fluids.  You may resume your normal diet as instructed by your doctor.  Begin with a light meal and progress to your normal diet. Heavy or fried foods are harder to digest and may make you feel sick to your stomach (nauseated).  Avoid alcoholic beverages for 24 hours or as instructed.  MEDICATIONS You may resume your normal medications unless your doctor tells you otherwise.  WHAT YOU CAN EXPECT TODAY Some feelings of bloating in the abdomen.  Passage of more gas than usual.  Spotting of blood in your stool or on the toilet paper.  IF YOU HAD POLYPS REMOVED DURING THE COLONOSCOPY: No aspirin products for 7 days or as instructed.  No alcohol for 7 days or as instructed.  Eat a soft diet for the next 24 hours.  FINDING OUT THE RESULTS OF YOUR TEST Not all test results are available during your visit. If your test results are not back during the visit, make an appointment with your caregiver to find out the  results. Do not assume everything is normal if you have not heard from your caregiver or the medical facility. It is important for you to follow up on all of your test results.  SEEK IMMEDIATE MEDICAL ATTENTION IF: You have more than a spotting of blood in your stool.  Your belly is swollen (abdominal distention).  You are nauseated or vomiting.  You have a temperature over 101.  You have abdominal pain or discomfort that is severe or gets worse throughout the day.      3 polyps found and removed  Further recommendations to follow pending review of pathology report

## 2024-10-16 NOTE — Op Note (Signed)
 [facility] Patient Name: Kyle Atkins Procedure Date: 10/16/2024 8:45 AM MRN: [id number] Date of Birth: 1976/06/05 Attending MD: Kyle Atkins , MD, [id number] CSN: [id number] Age: 48 Admit Type: Outpatient Procedure:                Colonoscopy Indications:              Positive Cologuard test Providers:                Kyle Ozell Hollingshead, MD, Leandrew Edelman RN, RN, Chad                            Wilson, Technician Referring MD:              Medicines:                Propofol  per Anesthesia Complications:            No immediate complications. Estimated Blood Loss:     Estimated blood loss was minimal. Estimated blood                            loss: none. Estimated blood loss was minimal. Procedure:                Pre-Anesthesia Assessment:                           - Prior to the procedure, a History and Physical                            was performed, and patient medications and                            allergies were reviewed. The patient's tolerance of                            previous anesthesia was also reviewed. The risks                            and benefits of the procedure and the sedation                            options and risks were discussed with the patient.                            All questions were answered, and informed consent                            was obtained. Prior Anticoagulants: The patient has                            taken no anticoagulant or antiplatelet agents. ASA                            Grade Assessment: II - A patient with mild systemic  disease. After reviewing the risks and benefits,                            the patient was deemed in satisfactory condition to                            undergo the procedure.                           After obtaining informed consent, the colonoscope                            was passed under direct vision. Throughout the                             procedure, the patient's blood pressure, pulse, and                            oxygen saturations were monitored continuously. The                            [id number] ([id number]) Colon was introduced through                            the anus and advanced to the the cecum, identified                            by appendiceal orifice and ileocecal valve. The                            colonoscopy was performed without difficulty. The                            patient tolerated the procedure well. The quality                            of the bowel preparation was adequate. The                            ileocecal valve, appendiceal orifice, and rectum                            were photographed. Scope In: 9:06:17 AM Scope Out: 9:24:29 AM Scope Withdrawal Time: 0 hours 10 minutes 41 seconds  Total Procedure Duration: 0 hours 18 minutes 12 seconds  Findings:      The perianal and digital rectal examinations were normal.      Two sessile polyps were found in the sigmoid colon. The polyps were 3 to       4 mm in size. These polyps were removed with a cold snare. Resection and       retrieval were complete. Estimated blood loss was minimal.      A 10 mm polyp was found in the sigmoid colon. The polyp was pedunculated.      The exam was otherwise without abnormality on direct and  retroflexion       views. Impression:               - Two 3 to 4 mm polyps in the sigmoid colon,                            removed with a cold snare. Resected and retrieved.                           - One polyp in the sigmoid colon, removed with a                            hot snare. Resected and retrieved.                           - The examination was otherwise normal on direct                            and retroflexion views. Moderate Sedation:      Moderate (conscious) sedation was personally administered by an       anesthesia professional. The following parameters were monitored: oxygen       saturation,  heart rate, blood pressure, respiratory rate, EKG, adequacy       of pulmonary ventilation, and response to care. Recommendation:           - Patient has a contact number available for                            emergencies. The signs and symptoms of potential                            delayed complications were discussed with the                            patient. Return to normal activities tomorrow.                            Written discharge instructions were provided to the                            patient.                           - Advance diet as tolerated.                           - Continue present medications.                           - Repeat colonoscopy date to be determined after                            pending pathology results are reviewed for                            surveillance.                           -  Return to GI office (date not yet determined). Procedure Code(s):        --- Professional ---                           [phone], Colonoscopy, flexible; with removal of                            tumor(s), polyp(s), or other lesion(s) by snare                            technique Diagnosis Code(s):        --- Professional ---                           D12.5, Benign neoplasm of sigmoid colon                           R19.5, Other fecal abnormalities CPT copyright 2022 American Medical Association. All rights reserved. The codes documented in this report are preliminary and upon coder review may  be revised to meet current compliance requirements. Kyle Atkins. Kyle Mcdowell, MD Kyle Ozell Hollingshead, MD 10/16/2024 9:29:45 AM This report has been signed electronically. Number of Addenda: 0

## 2024-10-16 NOTE — Interval H&P Note (Signed)
 History and Physical Interval Note:  10/16/2024 8:58 AM  Kyle Atkins  has presented today for surgery, with the diagnosis of positive FIT, history of polyps.  The various methods of treatment have been discussed with the patient and family. After consideration of risks, benefits and other options for treatment, the patient has consented to  Procedure(s) with comments: COLONOSCOPY (N/A) - 9:15 am, asa 2 as a surgical intervention.  The patient's history has been reviewed, patient examined, no change in status, stable for surgery.  I have reviewed the patient's chart and labs.  Questions were answered to the patient's satisfaction.     [name]    Positive FIT and Cologuard.  Here for diagnostic colonoscopy  The risks, benefits, limitations, alternatives and imponderables have been reviewed with the patient. Questions have been answered. All parties are agreeable.

## 2024-10-16 NOTE — Anesthesia Postprocedure Evaluation (Signed)
 Anesthesia Post Note  Patient: Kyle Atkins  Procedure(s) Performed: COLONOSCOPY  Patient location during evaluation: PACU Anesthesia Type: General Level of consciousness: awake and alert Pain management: pain level controlled Vital Signs Assessment: post-procedure vital signs reviewed and stable Respiratory status: spontaneous breathing, nonlabored ventilation, respiratory function stable and patient connected to nasal cannula oxygen Cardiovascular status: blood pressure returned to baseline and stable Postop Assessment: no apparent nausea or vomiting Anesthetic complications: no   No notable events documented.   Last Vitals:  Vitals:   10/16/24 0927 10/16/24 0938  BP: 124/71 122/73  Pulse: 83 65  Resp: 17 14  Temp: 36.6 C   SpO2: 96% 97%    Last Pain:  Vitals:   10/16/24 0938  TempSrc:   PainSc: 0-No pain                 Andrea Limes

## 2024-10-16 NOTE — Anesthesia Preprocedure Evaluation (Addendum)
 Anesthesia Evaluation  Patient identified by MRN, date of birth, ID band Patient awake    Reviewed: Allergy & Precautions, H&P , NPO status , Patient's Chart, lab work & pertinent test results  Airway Mallampati: I  TM Distance: >3 FB Neck ROM: Full    Dental no notable dental hx.    Pulmonary former smoker   Pulmonary exam normal breath sounds clear to auscultation       Cardiovascular negative cardio ROS Normal cardiovascular exam Rhythm:Regular Rate:Normal     Neuro/Psych  PSYCHIATRIC DISORDERS      negative neurological ROS     GI/Hepatic negative GI ROS, Neg liver ROS,,,  Endo/Other  negative endocrine ROS    Renal/GU negative Renal ROS  negative genitourinary   Musculoskeletal  (+) Arthritis ,    Abdominal   Peds negative pediatric ROS (+)  Hematology negative hematology ROS (+)   Anesthesia Other Findings   Reproductive/Obstetrics negative OB ROS                              Anesthesia Physical Anesthesia Plan  ASA: 1  Anesthesia Plan: General   Post-op Pain Management:    Induction: Intravenous  PONV Risk Score and Plan:   Airway Management Planned: Nasal Cannula  Additional Equipment:   Intra-op Plan:   Post-operative Plan:   Informed Consent: I have reviewed the patients History and Physical, chart, labs and discussed the procedure including the risks, benefits and alternatives for the proposed anesthesia with the patient or authorized representative who has indicated his/her understanding and acceptance.     Dental advisory given  Plan Discussed with: CRNA  Anesthesia Plan Comments:          Anesthesia Quick Evaluation

## 2024-10-16 NOTE — Transfer of Care (Signed)
 Immediate Anesthesia Transfer of Care Note  Patient: Kyle Atkins  Procedure(s) Performed: COLONOSCOPY  Patient Location: PACU  Anesthesia Type:General  Level of Consciousness: awake, alert , drowsy, and patient cooperative  Airway & Oxygen Therapy: Patient Spontanous Breathing  Post-op Assessment: Report given to RN, Post -op Vital signs reviewed and stable, and Patient moving all extremities X 4  Post vital signs: Reviewed and stable  Last Vitals:  Vitals Value Taken Time  BP 124/71 10/16/24 09:27  Temp 36.6 C 10/16/24 09:27  Pulse 83 10/16/24 09:27  Resp 17 10/16/24 09:27  SpO2 96 % 10/16/24 09:27    Last Pain:  Vitals:   10/16/24 0927  TempSrc: Oral  PainSc:       Patients Stated Pain Goal: 10 (10/16/24 0802)  Complications: No notable events documented.

## 2024-10-17 LAB — SURGICAL PATHOLOGY

## 2024-10-18 ENCOUNTER — Ambulatory Visit: Payer: Self-pay | Admitting: Internal Medicine

## 2024-11-08 ENCOUNTER — Other Ambulatory Visit (HOSPITAL_COMMUNITY): Payer: Self-pay | Admitting: Orthopedic Surgery

## 2024-11-08 DIAGNOSIS — M25512 Pain in left shoulder: Secondary | ICD-10-CM

## 2024-11-17 ENCOUNTER — Ambulatory Visit (HOSPITAL_COMMUNITY)
Admission: RE | Admit: 2024-11-17 | Discharge: 2024-11-17 | Disposition: A | Source: Ambulatory Visit | Attending: Orthopedic Surgery

## 2024-11-17 DIAGNOSIS — M25512 Pain in left shoulder: Secondary | ICD-10-CM | POA: Diagnosis present
